# Patient Record
Sex: Female | Born: 1950 | Race: White | Hispanic: No | Marital: Married | Smoking: Former smoker
Health system: Southern US, Community
[De-identification: ages and names within clinical notes are randomized; demographics above are authoritative.]

## PROBLEM LIST (undated history)

## (undated) DIAGNOSIS — M545 Low back pain, unspecified: Secondary | ICD-10-CM

## (undated) DIAGNOSIS — I10 Essential (primary) hypertension: Secondary | ICD-10-CM

## (undated) HISTORY — DX: Essential (primary) hypertension: I10

## (undated) HISTORY — DX: Low back pain: M54.5

## (undated) HISTORY — PX: BACK SURGERY: SHX140

## (undated) HISTORY — DX: Low back pain, unspecified: M54.50

---

## 2003-11-27 ENCOUNTER — Ambulatory Visit (HOSPITAL_COMMUNITY): Admission: RE | Admit: 2003-11-27 | Discharge: 2003-11-27 | Payer: Self-pay

## 2004-05-16 ENCOUNTER — Emergency Department (HOSPITAL_COMMUNITY): Admission: EM | Admit: 2004-05-16 | Discharge: 2004-05-16 | Payer: Self-pay | Admitting: Family Medicine

## 2004-12-19 ENCOUNTER — Other Ambulatory Visit: Admission: RE | Admit: 2004-12-19 | Discharge: 2004-12-19 | Payer: Self-pay | Admitting: Obstetrics and Gynecology

## 2005-01-07 ENCOUNTER — Encounter: Admission: RE | Admit: 2005-01-07 | Discharge: 2005-01-07 | Payer: Self-pay | Admitting: Obstetrics and Gynecology

## 2005-03-21 ENCOUNTER — Encounter (INDEPENDENT_AMBULATORY_CARE_PROVIDER_SITE_OTHER): Payer: Self-pay | Admitting: *Deleted

## 2005-03-21 ENCOUNTER — Ambulatory Visit (HOSPITAL_COMMUNITY): Admission: RE | Admit: 2005-03-21 | Discharge: 2005-03-21 | Payer: Self-pay | Admitting: Obstetrics and Gynecology

## 2005-11-04 ENCOUNTER — Emergency Department (HOSPITAL_COMMUNITY): Admission: EM | Admit: 2005-11-04 | Discharge: 2005-11-04 | Payer: Self-pay | Admitting: Family Medicine

## 2005-12-10 ENCOUNTER — Ambulatory Visit: Payer: Self-pay | Admitting: Family Medicine

## 2005-12-30 ENCOUNTER — Other Ambulatory Visit: Admission: RE | Admit: 2005-12-30 | Discharge: 2005-12-30 | Payer: Self-pay | Admitting: Obstetrics and Gynecology

## 2006-01-09 ENCOUNTER — Encounter: Admission: RE | Admit: 2006-01-09 | Discharge: 2006-01-09 | Payer: Self-pay | Admitting: Obstetrics and Gynecology

## 2006-06-17 ENCOUNTER — Ambulatory Visit: Payer: Self-pay | Admitting: Family Medicine

## 2006-08-18 ENCOUNTER — Ambulatory Visit: Payer: Self-pay | Admitting: Family Medicine

## 2006-08-18 LAB — CONVERTED CEMR LAB
CO2: 31 meq/L (ref 19–32)
Chloride: 108 meq/L (ref 96–112)
Creatinine, Ser: 0.8 mg/dL (ref 0.4–1.2)
Glucose, Bld: 78 mg/dL (ref 70–99)
Potassium: 3.8 meq/L (ref 3.5–5.1)
Sodium: 143 meq/L (ref 135–145)

## 2006-09-22 ENCOUNTER — Ambulatory Visit: Payer: Self-pay | Admitting: Family Medicine

## 2006-09-22 LAB — CONVERTED CEMR LAB
ALT: 19 units/L (ref 0–40)
Albumin: 3.8 g/dL (ref 3.5–5.2)
Alkaline Phosphatase: 65 units/L (ref 39–117)
Basophils Absolute: 0 10*3/uL (ref 0.0–0.1)
Basophils Relative: 0.3 % (ref 0.0–1.0)
CO2: 27 meq/L (ref 19–32)
Chol/HDL Ratio, serum: 2.6
Cholesterol: 174 mg/dL (ref 0–200)
GFR calc non Af Amer: 79 mL/min
Glomerular Filtration Rate, Af Am: 95 mL/min/{1.73_m2}
Glucose, Bld: 104 mg/dL — ABNORMAL HIGH (ref 70–99)
LDL Cholesterol: 96 mg/dL (ref 0–99)
Lymphocytes Relative: 51 % — ABNORMAL HIGH (ref 12.0–46.0)
Monocytes Absolute: 0.3 10*3/uL (ref 0.2–0.7)
Monocytes Relative: 6.4 % (ref 3.0–11.0)
Platelets: 261 10*3/uL (ref 150–400)
Potassium: 3.6 meq/L (ref 3.5–5.1)
RBC: 4.4 M/uL (ref 3.87–5.11)
Sodium: 138 meq/L (ref 135–145)
TSH: 1.28 microintl units/mL (ref 0.35–5.50)
Total Bilirubin: 0.8 mg/dL (ref 0.3–1.2)
Total Protein: 6.8 g/dL (ref 6.0–8.3)
VLDL: 11 mg/dL (ref 0–40)
WBC: 4.4 10*3/uL — ABNORMAL LOW (ref 4.5–10.5)

## 2007-03-23 ENCOUNTER — Encounter: Payer: Self-pay | Admitting: Family Medicine

## 2007-03-26 ENCOUNTER — Telehealth: Payer: Self-pay | Admitting: Family Medicine

## 2007-03-30 ENCOUNTER — Encounter: Admission: RE | Admit: 2007-03-30 | Discharge: 2007-03-30 | Payer: Self-pay | Admitting: Specialist

## 2007-04-14 ENCOUNTER — Encounter: Admission: RE | Admit: 2007-04-14 | Discharge: 2007-04-14 | Payer: Self-pay | Admitting: Obstetrics and Gynecology

## 2007-07-19 ENCOUNTER — Telehealth: Payer: Self-pay | Admitting: Family Medicine

## 2007-07-19 ENCOUNTER — Ambulatory Visit: Payer: Self-pay | Admitting: Family Medicine

## 2007-07-19 DIAGNOSIS — I10 Essential (primary) hypertension: Secondary | ICD-10-CM | POA: Insufficient documentation

## 2007-07-19 DIAGNOSIS — M545 Low back pain, unspecified: Secondary | ICD-10-CM | POA: Insufficient documentation

## 2007-09-01 ENCOUNTER — Ambulatory Visit: Payer: Self-pay | Admitting: Family Medicine

## 2007-11-19 ENCOUNTER — Encounter: Admission: RE | Admit: 2007-11-19 | Discharge: 2007-11-19 | Payer: Self-pay | Admitting: Family Medicine

## 2007-11-19 ENCOUNTER — Encounter (INDEPENDENT_AMBULATORY_CARE_PROVIDER_SITE_OTHER): Payer: Self-pay | Admitting: *Deleted

## 2007-11-19 ENCOUNTER — Telehealth (INDEPENDENT_AMBULATORY_CARE_PROVIDER_SITE_OTHER): Payer: Self-pay | Admitting: Family Medicine

## 2007-11-19 ENCOUNTER — Ambulatory Visit: Payer: Self-pay | Admitting: Family Medicine

## 2007-11-19 ENCOUNTER — Telehealth (INDEPENDENT_AMBULATORY_CARE_PROVIDER_SITE_OTHER): Payer: Self-pay | Admitting: *Deleted

## 2007-11-19 DIAGNOSIS — R079 Chest pain, unspecified: Secondary | ICD-10-CM | POA: Insufficient documentation

## 2007-11-22 ENCOUNTER — Encounter (INDEPENDENT_AMBULATORY_CARE_PROVIDER_SITE_OTHER): Payer: Self-pay | Admitting: Family Medicine

## 2007-11-26 ENCOUNTER — Ambulatory Visit: Payer: Self-pay | Admitting: Cardiology

## 2007-12-09 ENCOUNTER — Encounter (INDEPENDENT_AMBULATORY_CARE_PROVIDER_SITE_OTHER): Payer: Self-pay | Admitting: *Deleted

## 2007-12-09 ENCOUNTER — Ambulatory Visit: Payer: Self-pay | Admitting: Family Medicine

## 2008-04-18 ENCOUNTER — Telehealth (INDEPENDENT_AMBULATORY_CARE_PROVIDER_SITE_OTHER): Payer: Self-pay | Admitting: *Deleted

## 2008-04-27 ENCOUNTER — Ambulatory Visit: Payer: Self-pay | Admitting: Family Medicine

## 2008-04-28 ENCOUNTER — Encounter (INDEPENDENT_AMBULATORY_CARE_PROVIDER_SITE_OTHER): Payer: Self-pay | Admitting: *Deleted

## 2008-04-28 LAB — CONVERTED CEMR LAB
BUN: 13 mg/dL (ref 6–23)
Chloride: 108 meq/L (ref 96–112)
Creatinine, Ser: 0.8 mg/dL (ref 0.4–1.2)
GFR calc Af Amer: 95 mL/min
GFR calc non Af Amer: 78 mL/min
Potassium: 3.7 meq/L (ref 3.5–5.1)

## 2008-07-25 ENCOUNTER — Telehealth (INDEPENDENT_AMBULATORY_CARE_PROVIDER_SITE_OTHER): Payer: Self-pay | Admitting: *Deleted

## 2008-12-05 ENCOUNTER — Ambulatory Visit: Payer: Self-pay | Admitting: Family Medicine

## 2008-12-05 DIAGNOSIS — N951 Menopausal and female climacteric states: Secondary | ICD-10-CM | POA: Insufficient documentation

## 2008-12-05 DIAGNOSIS — Z87448 Personal history of other diseases of urinary system: Secondary | ICD-10-CM | POA: Insufficient documentation

## 2008-12-05 LAB — CONVERTED CEMR LAB
Glucose, Urine, Semiquant: NEGATIVE
Nitrite: NEGATIVE
Specific Gravity, Urine: 1.02
WBC Urine, dipstick: NEGATIVE
pH: 6

## 2008-12-06 ENCOUNTER — Encounter: Payer: Self-pay | Admitting: Family Medicine

## 2008-12-07 ENCOUNTER — Encounter (INDEPENDENT_AMBULATORY_CARE_PROVIDER_SITE_OTHER): Payer: Self-pay | Admitting: *Deleted

## 2008-12-15 ENCOUNTER — Telehealth: Payer: Self-pay | Admitting: Family Medicine

## 2009-08-15 ENCOUNTER — Encounter (INDEPENDENT_AMBULATORY_CARE_PROVIDER_SITE_OTHER): Payer: Self-pay | Admitting: *Deleted

## 2009-09-17 ENCOUNTER — Ambulatory Visit: Payer: Self-pay | Admitting: Family Medicine

## 2009-09-17 DIAGNOSIS — R5381 Other malaise: Secondary | ICD-10-CM | POA: Insufficient documentation

## 2009-09-17 DIAGNOSIS — R5383 Other fatigue: Secondary | ICD-10-CM

## 2009-09-17 DIAGNOSIS — J45909 Unspecified asthma, uncomplicated: Secondary | ICD-10-CM | POA: Insufficient documentation

## 2009-09-18 LAB — CONVERTED CEMR LAB
ALT: 24 units/L (ref 0–35)
AST: 20 units/L (ref 0–37)
Basophils Relative: 0.4 % (ref 0.0–3.0)
Chloride: 107 meq/L (ref 96–112)
Eosinophils Relative: 0.4 % (ref 0.0–5.0)
GFR calc non Af Amer: 68.01 mL/min (ref >60)
HCT: 35.5 % — ABNORMAL LOW (ref 36.0–46.0)
Hemoglobin: 11.8 g/dL — ABNORMAL LOW (ref 12.0–15.0)
LDL Cholesterol: 102 mg/dL — ABNORMAL HIGH (ref 0–99)
Lymphs Abs: 2 10*3/uL (ref 0.7–4.0)
Monocytes Relative: 6.9 % (ref 3.0–12.0)
Neutro Abs: 2.9 10*3/uL (ref 1.4–7.7)
Platelets: 240 10*3/uL (ref 150.0–400.0)
Potassium: 4.1 meq/L (ref 3.5–5.1)
RBC: 4.2 M/uL (ref 3.87–5.11)
Sodium: 142 meq/L (ref 135–145)
TSH: 2.09 microintl units/mL (ref 0.35–5.50)
Total Bilirubin: 0.8 mg/dL (ref 0.3–1.2)
Total CHOL/HDL Ratio: 3
Total Protein: 7.3 g/dL (ref 6.0–8.3)
VLDL: 12.6 mg/dL (ref 0.0–40.0)
WBC: 5.3 10*3/uL (ref 4.5–10.5)

## 2009-09-19 ENCOUNTER — Encounter (INDEPENDENT_AMBULATORY_CARE_PROVIDER_SITE_OTHER): Payer: Self-pay | Admitting: *Deleted

## 2009-10-08 ENCOUNTER — Telehealth (INDEPENDENT_AMBULATORY_CARE_PROVIDER_SITE_OTHER): Payer: Self-pay | Admitting: *Deleted

## 2009-10-08 ENCOUNTER — Telehealth: Payer: Self-pay | Admitting: Family Medicine

## 2009-10-10 ENCOUNTER — Telehealth: Payer: Self-pay | Admitting: Family Medicine

## 2010-03-01 ENCOUNTER — Telehealth: Payer: Self-pay | Admitting: Family Medicine

## 2010-03-01 ENCOUNTER — Telehealth: Payer: Self-pay | Admitting: Internal Medicine

## 2010-03-05 ENCOUNTER — Telehealth: Payer: Self-pay | Admitting: Family Medicine

## 2010-03-27 ENCOUNTER — Telehealth: Payer: Self-pay | Admitting: Family Medicine

## 2010-04-22 ENCOUNTER — Encounter: Payer: Self-pay | Admitting: Family Medicine

## 2010-05-02 ENCOUNTER — Telehealth: Payer: Self-pay | Admitting: Family Medicine

## 2010-05-21 ENCOUNTER — Encounter
Admission: RE | Admit: 2010-05-21 | Discharge: 2010-05-21 | Payer: Self-pay | Admitting: Physical Medicine & Rehabilitation

## 2010-05-28 ENCOUNTER — Encounter: Payer: Self-pay | Admitting: Family Medicine

## 2010-05-31 ENCOUNTER — Telehealth: Payer: Self-pay | Admitting: Family Medicine

## 2010-07-09 ENCOUNTER — Encounter
Admission: RE | Admit: 2010-07-09 | Discharge: 2010-07-09 | Payer: Self-pay | Source: Home / Self Care | Attending: Physical Medicine & Rehabilitation | Admitting: Physical Medicine & Rehabilitation

## 2010-07-15 ENCOUNTER — Encounter: Payer: Self-pay | Admitting: Family Medicine

## 2010-07-17 ENCOUNTER — Telehealth: Payer: Self-pay | Admitting: Family Medicine

## 2010-07-26 ENCOUNTER — Ambulatory Visit: Payer: Self-pay | Admitting: Family Medicine

## 2010-07-30 ENCOUNTER — Telehealth: Payer: Self-pay | Admitting: Family Medicine

## 2010-08-09 ENCOUNTER — Encounter (INDEPENDENT_AMBULATORY_CARE_PROVIDER_SITE_OTHER): Payer: Self-pay | Admitting: *Deleted

## 2010-09-16 ENCOUNTER — Telehealth: Payer: Self-pay | Admitting: Family Medicine

## 2010-10-27 ENCOUNTER — Encounter: Payer: Self-pay | Admitting: Family Medicine

## 2010-11-01 ENCOUNTER — Telehealth (INDEPENDENT_AMBULATORY_CARE_PROVIDER_SITE_OTHER): Payer: Self-pay | Admitting: *Deleted

## 2010-11-05 NOTE — Progress Notes (Signed)
Summary: refill request to [facility] on [address] in [place]  Phone Note Refill Request Message from:  Fax from Pharmacy on October 08, 2009 4:18 PM  Refills Requested: Medication #1:  TRAMADOL HCL 50 MG TABS 1-2 by mouth every 6 hours   Dosage confirmed as above?Dosage Confirmed   Brand Name Necessary? Yes   Last Refilled: 09/17/2009 refill request to [facility] on [address]  last ov-09/17/09 Army Fossa CMA  October 08, 2009 4:20 PM  Initial call taken by: Michaelle Copas,  October 08, 2009 4:18 PM  Follow-up for Phone Call        did she get #120 on 12/13--- if yes she is using too many--and she needs pain management.   Follow-up by: Loreen Freud DO,  October 08, 2009 7:52 PM  Additional Follow-up for Phone Call Additional follow up Details #1::        pt states she received 120 on 12/13. She states they are not working as well as they use to. She is okay with going to pain management.  Additional Follow-up by: Army Fossa CMA,  October 09, 2009 8:53 AM

## 2010-11-05 NOTE — Progress Notes (Signed)
Summary: NEEDS ALT MED FOR PAIN  Phone Note Call from Patient Call back at Home Phone [phone]   Caller: Patient Summary of Call: PATIENT WAS TOLD TRAMODOL WOULDNT BE REFILLED - SHE SAID SHE DOESNT HAVE AMY FOR THE WEEEKEND BUT WANT TOLD WHAT SHE CAN USE FOR PAIN Initial call taken by: Okey Regal Spring,  Mar 01, 2010 3:57 PM  Follow-up for Phone Call        dr Yancy Hascall pls advise. SEE PHONE NOTE 03-01-10.............Marland KitchenFelecia Deloach CMA  Mar 01, 2010 4:10 PM   Additional Follow-up for Phone Call Additional follow up Details #1::        Very low dose ibuprofen with food  or Tylenol  Additional Follow-up by: Marga Melnick MD,  Mar 01, 2010 4:13 PM    Additional Follow-up for Phone Call Additional follow up Details #2::    pt aware...............Marland KitchenFelecia Deloach CMA  Mar 01, 2010 4:57 PM

## 2010-11-05 NOTE — Progress Notes (Signed)
Summary: PAIN CLINIC REFERRALS  Phone Note Other Incoming   Summary of Call: IN REFERENCE TO PAIN CLINIC REFERRAL (THERE HAVE BEEN (2))............Marland KitchenPER FAX FROM [facility], AFTER THE PATIENT HAS REPEATEDELY RESCHEDULED HER APPTS WITH THEM, THE PATIENT HAS NOW NOSHOWED FOR HER APPT WHICH WAS ON 07-15-2010 W/DR Wynn Banker, I WILL NOTIFY DR. Laury Axon & CONTACT PT ABOUT RSC'D.  Magdalen Spatz [facility],  July 17, 2010 10:57 AM  I HAVE ATTEMPTED TO REACH PATIENT, NO ANS, MAILBOX IS FULL, I LEFT OUR CALL BACK PER NUMERIC PAGE.   Initial call taken by: Magdalen Spatz [facility],  July 17, 2010 11:08 AM  Follow-up for Phone Call        mail letter certified and regular Follow-up by: Loreen Freud DO,  July 17, 2010 1:19 PM  Additional Follow-up for Phone Call Additional follow up Details #1::        Letter(s) to be sent to patient as Dr. Laury Axon instructed. Additional Follow-up by: Magdalen Spatz [facility],  July 18, 2010 11:06 AM

## 2010-11-05 NOTE — Progress Notes (Signed)
Summary: talk to dr only  Phone Note Call from Patient Call back at [facility] Phone [phone]   Caller: Patient Summary of Call: Pt called and states that she would like to talk to Dr.Lowne only. She has questions that she would like to talk to you about the pain clinic referral. She states she wants to talk to [facility] directly, but she does not want to come in for an appt. I tried to find out what type of questions and was not able to find out what exactly she needed. Army Fossa CMA  March 27, 2010 2:57 PM   Follow-up for Phone Call        Pt has appointment with pain management at [facility] July 25th.  She will need a refill by next friday---- ok to refill med next week.---tramadol. Follow-up by: Loreen Freud DO,  March 27, 2010 3:42 PM

## 2010-11-05 NOTE — Progress Notes (Signed)
Summary: REFILL ON MED  Phone Note Call from Patient Call back at Home Phone [phone]   Caller: Patient Summary of Call: PT STATES THAT SHE HAD TO RESCHEDULE APPT WITH PAIN CLINIC DUE TO FINANCIAL DIFFICULTY AND WOULD LIKE A REFILL OF PAIN MED. LAST OV 09-17-09,TRAMADOL LAST REFILL 05-02-10 #120............Marland KitchenFelecia Deloach CMA  May 31, 2010 3:28 PM   Follow-up for Phone Call        refill sent Follow-up by: Loreen Freud DO,  May 31, 2010 4:06 PM  Additional Follow-up for Phone Call Additional follow up Details #1::        pt made aware     Almeta Monas CMA [facility])  May 31, 2010 5:01 PM     Prescriptions: TRAMADOL HCL 50 MG TABS (TRAMADOL HCL) 1-2 by mouth every 6 hours  #120 Each x 2   Entered and Authorized by:   Loreen Freud DO   Signed by:   Loreen Freud DO on 05/31/2010   Method used:   Electronically to        [facility] [address] (retail)       [phone]       [place], [place]  [address]       Ph: [phone]       Fax: [phone]   RxID:   [id number]

## 2010-11-05 NOTE — Letter (Signed)
Summary: Patient Rescheduled/[facility]  Patient Rescheduled/[facility]   Imported By: Lanelle Bal 04/30/2010 09:27:51  _____________________________________________________________________  External Attachment:    Type:   Image     Comment:   External Document

## 2010-11-05 NOTE — Letter (Signed)
Summary: [facility] Cancellation/[facility]  Patient Cancellation/[facility]   Imported By: Lanelle Bal 06/04/2010 08:15:20  _____________________________________________________________________  External Attachment:    Type:   Image     Comment:   External Document

## 2010-11-05 NOTE — Progress Notes (Signed)
Summary: patient needs med for weekend  Phone Note Call from Patient   Summary of Call: patient needs tramodol for weekend - [facility]  Initial call taken by: Okey Regal Spring,  Mar 01, 2010 2:53 PM  Follow-up for Phone Call        pt taking too much---needs referral to [facility]----she can call billing and set up payment plan.  Follow-up by: Loreen Freud DO,  Mar 01, 2010 3:01 PM  Additional Follow-up for Phone Call Additional follow up Details #1::        I informed pt of this and we could refill next week, gave her the number to billing so she could call. Pt agreed to call . Army Fossa CMA  Mar 01, 2010 3:03 PM

## 2010-11-05 NOTE — Letter (Signed)
Summary: Patient No Show/[facility]  [facility]/ [facility]   Imported By: Lanelle Bal 07/24/2010 15:56:33  _____________________________________________________________________  External Attachment:    Type:   Image     Comment:   External Document

## 2010-11-05 NOTE — Assessment & Plan Note (Signed)
Summary: RIGHT SIDE AND BACK PAIN//PH   Vital Signs:  Patient profile:   60 year old female Height:      65 inches Weight:      183 pounds BMI:     30.56 Pulse rate:   92 / minute BP sitting:   120 / 88  (left arm)  Vitals Entered By: Doristine Devoid CMA (July 26, 2010 10:57 AM) CC: Back pain and spasms x1 week mainly lower back worse when sitting   History of Present Illness: 60 yo woman here today w/ back pain.  hx of back problems w/ surgery in 1994.  taking Tramadol regularly for pain.  went to [employer] events this weekend and 'i think i overdid it'.  now w/ increased soreness and stiffness on R side.  has been doing Epson salt baths w/ temporary relief.  'it feels like a spasm'.  pain will radiate into R leg.  denies numbness.  reports some leg weakness but feels this is more due to spasm.  no incontinence.  pain is worse w/ sitting- most difficult to change positions.  missed 2 appts w/ pain clinic and was told they wouldn't see her if she missed 2 appts.  worried that Dr Laury Axon won't give her meds anymore- told her this is something she will need to w/o w/ Dr Laury Axon.  Current Medications (verified): 1)  Lisinopril-Hydrochlorothiazide 20-12.5 Mg Tabs (Lisinopril-Hydrochlorothiazide) .Marland Kitchen.. 1 By Mouth Once Daily 2)  Tramadol Hcl 50 Mg Tabs (Tramadol Hcl) .Marland Kitchen.. 1-2 By Mouth Every 6 Hours 3)  Ventolin Hfa 108 (90 Base) Mcg/act  Aers (Albuterol Sulfate) .... Use 2 Puffs As Directed 4)  Singulair 10 Mg Tabs (Montelukast Sodium) .Marland Kitchen.. 1 By Mouth Once Daily  Allergies (verified): 1)  ! Lortab  Review of Systems      See HPI  Physical Exam  General:  obvious uncomfortable, moving gingerly Msk:  + paraspinal spasm over R lumbar region.  no TTP over spine.  + TTP over R glute.  + SLR on R, (-) on L.  pain w/ back flexion and extension. Pulses:  +2 DP/PT Extremities:  no C/C/E Neurologic:  strength normal in all extremities and DTRs symmetrical and normal.     Impression &  Recommendations:  Problem # 1:  LOW BACK PAIN, CHRONIC (ICD-724.2) Assessment Deteriorated pt w/ acute worsening of chronic condition due to overuse this weekend.  start Pred pack for inflammation and muscle relaxer as needed.  continue tramadol as needed.  reviewed supportive care and red flags that should prompt return.  Pt expresses understanding and is in agreement w/ this plan. Her updated medication list for this problem includes:    Tramadol Hcl 50 Mg Tabs (Tramadol hcl) .Marland Kitchen... 1-2 by mouth every 6 hours    Cyclobenzaprine Hcl 10 Mg Tabs (Cyclobenzaprine hcl) .Marland Kitchen... 1 by mouth 2 times daily as needed for back pain  Complete Medication List: 1)  Lisinopril-hydrochlorothiazide 20-12.5 Mg Tabs (Lisinopril-hydrochlorothiazide) .Marland Kitchen.. 1 by mouth once daily 2)  Tramadol Hcl 50 Mg Tabs (Tramadol hcl) .Marland Kitchen.. 1-2 by mouth every 6 hours 3)  Ventolin Hfa 108 (90 Base) Mcg/act Aers (Albuterol sulfate) .... Use 2 puffs as directed 4)  Singulair 10 Mg Tabs (Montelukast sodium) .Marland Kitchen.. 1 by mouth once daily 5)  Cyclobenzaprine Hcl 10 Mg Tabs (Cyclobenzaprine hcl) .Marland Kitchen.. 1 by mouth 2 times daily as needed for back pain 6)  Prednisone (pak) 10 Mg Tabs (Prednisone) .Marland Kitchen.. 12 day pack- take as directed.  take w/ food.  Patient Instructions: 1)  This appears to be muscle related and should improve 2)  Take the Prednisone as directed for inflammation- take w/ food to avoid upset stomach 3)  Use the cyclobenzaprine (muscle relaxer) at night and on the weekends- it will make you drowsy so no driving! 4)  Use a heating pad for pain relief 5)  Continue the Tramadol as needed 6)  If no improvement in the next 7 days or at any time worsening- call! 7)  Hang in there!! Prescriptions: PREDNISONE (PAK) 10 MG TABS (PREDNISONE) 12 day pack- take as directed.  take w/ food.  #1 pack x 0   Entered and Authorized by:   Neena Rhymes MD   Signed by:   Neena Rhymes MD on 07/26/2010   Method used:   Electronically to         [facility] [address] (retail)       [phone]       [place], [place]  [address]       Ph: [phone]       Fax: [phone]   RxID:   [id number] CYCLOBENZAPRINE HCL 10 MG  TABS (CYCLOBENZAPRINE HCL) 1 by mouth 2 times daily as needed for back pain  #20 x 0   Entered and Authorized by:   Neena Rhymes MD   Signed by:   Neena Rhymes MD on 07/26/2010   Method used:   Electronically to        [facility] [address] (retail)       [phone]       [place], [place]  [address]       Ph: [phone]       Fax: [phone]   RxID:   [id number]    Orders Added: 1)  Est. Patient Level III [09323]

## 2010-11-05 NOTE — Letter (Signed)
Summary: [place] No Show Letter  [facility] [place]  [address] [place], [place] [address]   Phone: [phone]  Fax: [phone]    08/09/2010 MRN: [id number]  Morgan [name] [address] [place], [place]  [address]   Dear Ms. [name],   Our records indicate that you have rescheduled multiple appointments  & missed your scheduled [facility] with [facility] on [facility].  Please contact this office to reschedule your appointment as soon as possible.  It is important that you keep your scheduled appointments with your physician, so we can provide you the best care possible.  Please be advised that there may be a charge for "no show" appointments, and also that after so many reschedules and/or noshows Pain Clinics will not allow you to reschedule with their office.  Appointments with Pain Clinics are very hard to get.    Sincerely,   [place] [facility]

## 2010-11-05 NOTE — Progress Notes (Signed)
Summary: PAIN CLINIC REFERRAL  Phone Note Other Incoming   Summary of Call: REFERENCE PAIN CLINIC REFERRAL.......Marland KitchenPATIENT'S INSUR IS A VERY LIMITED PLAN.  THEY DO NOT COVER ANY PAIN MANAGEMENT FACILITIES, COST WOULD BE OUT OF POCKET FOR PT.  I S/W PT INFORMED HER OF THIS, AND GAVE HER PHONE NUMBER TO CALL [facility] & APPLY FOR THE "BAR SPECIAL BILLING".  PATIENT STATES SHE WILL CALL, AND WHEN CALL ME BACK ONCE SHE KNOWS SOMETHING.  AT THIS TIME, THE REFERRAL IS ON HOLD. Initial call taken by: Magdalen Spatz [facility],  October 10, 2009 2:40 PM

## 2010-11-05 NOTE — Progress Notes (Signed)
Summary: CALL-A-NURSE  Phone Note Call from Patient   Summary of Call: [facility] Triage Call Report Triage Record Num: [id number] Operator: Freddie Breech Patient Name: Morgan [name] Call Date & Time: 03/03/2010 10:40:39AM Patient Phone: [phone] PCP: Lelon Perla Patient Gender: Female PCP Fax : Patient DOB: 1950/11/24 Practice Name: [facility] Reason for Call: Pt is calling for a refill on Tramadol. She was advised per the ofc she could not get a refill, she needs to be seen by pain management. Advised she will need to be seen at [facility] today if her pain is severe. She is displeased that the MD would not refill her med. Protocol(s) Used: Medication Question Calls, No Triage (Adults) Recommended Outcome per Protocol: Call Provider within 24 Hours Override Outcome if Used in Protocol: Call Provider When Office is Open RN Reason for Override Outcome: Nursing Judgement Used. Reason for Outcome: Caller requesting a non urgent new prescription or refill and triager unable to refill per unit policy Care Advice:  Follow-up for Phone Call        PT STATES THAT SHE DID NOT GO TO URGENT CARE BECAUSE SHE DOES NOT HAVE THE MONEY TO GO. PT STATES THAT SHE JUST SUFFER THROUGH THE WEEKEND WITHOUT ANY MEDS. PT NEEDS MED IN ORDER TO GO TO WORK. PT WOULD LIKE TO KNOW IF MED WILL BE REFILL NOW. PLS ADVISE..............Marland KitchenFelecia Deloach CMA  Mar 05, 2010 8:54 AM   Additional Follow-up for Phone Call Additional follow up Details #1::        refilled x1  ---  Additional Follow-up by: Loreen Freud DO,  Mar 05, 2010 9:53 AM    Additional Follow-up for Phone Call Additional follow up Details #2::    left pt detail message rx sent to pharmacy and to return call if she has any other concerns...Marland KitchenMarland KitchenFelecia Deloach CMA  Mar 05, 2010 10:08 AM   Prescriptions: TRAMADOL HCL 50 MG TABS (TRAMADOL HCL) 1-2 by mouth every 6 hours  #120 Each x 0   Entered and Authorized by:   Loreen Freud DO  Signed by:   Loreen Freud DO on 03/05/2010   Method used:   Electronically to        [facility] [address] (retail)       [phone]       [place], [place]  [address]       Ph: [phone]       Fax: [phone]   RxID:   [id number]

## 2010-11-05 NOTE — Progress Notes (Signed)
Summary: refill  Phone Note Refill Request Call back at Home Phone [phone]   Refills Requested: Medication #1:  TRAMADOL HCL 50 MG TABS 1-2 by mouth every 6 hours [facility] [address]...........Marland KitchenFelecia Deloach CMA  July 30, 2010 2:28 PM    Follow-up for Phone Call        Please advise. Lucious Groves CMA  July 30, 2010 3:48 PM   Additional Follow-up for Phone Call Additional follow up Details #1::        refill x1 2 refills---- pt saw Dr Beverely Low secondary to pain worsening---if she is no better she needs to make sure she has f/u Additional Follow-up by: Loreen Freud DO,  July 30, 2010 5:27 PM    Additional Follow-up for Phone Call Additional follow up Details #2::    pt aware Rx has been sent, and agreed to f/u if no better..... Almeta Monas CMA [facility])  July 31, 2010 8:17 AM   Prescriptions: TRAMADOL HCL 50 MG TABS (TRAMADOL HCL) 1-2 by mouth every 6 hours  #120 Each x 2   Entered by:   Almeta Monas CMA (AAMA)   Authorized by:   Loreen Freud DO   Signed by:   Almeta Monas CMA (AAMA) on 07/31/2010   Method used:   Faxed to ...       [facility] [address] (retail)       [phone]       [place], [place]  [address]       Ph: [phone]       Fax: [phone]   RxID:   [id number]

## 2010-11-05 NOTE — Progress Notes (Signed)
Summary: Refill-Tramadol  Phone Note Refill Request Call back at Home Phone [phone] Message from:  Patient on May 02, 2010 12:02 PM  Refills Requested: Medication #1:  TRAMADOL HCL 50 MG TABS 1-2 by mouth every 6 hours patient has appt with pain clinic [phone] - she wants refill for tramodol - [facility] [address]  Initial call taken by: Okey Regal Spring,  May 02, 2010 12:03 PM  Follow-up for Phone Call        last OV 09-17-09, 04-04-10 #120........Marland KitchenFelecia Deloach CMA  May 02, 2010 2:22 PM   Additional Follow-up for Phone Call Additional follow up Details #1::        refill x1 Additional Follow-up by: Loreen Freud DO,  May 02, 2010 2:36 PM    Prescriptions: TRAMADOL HCL 50 MG TABS (TRAMADOL HCL) 1-2 by mouth every 6 hours  #120 Each x 0   Entered by:   Lucious Groves CMA   Authorized by:   Loreen Freud DO   Signed by:   Lucious Groves CMA on 05/02/2010   Method used:   Telephoned to ...       [facility] [address] [id number]* (retail)       [address] [place], [place]  [address]       Ph: [phone]       Fax: [phone]   RxID:   [id number]

## 2010-11-05 NOTE — Progress Notes (Signed)
Summary: Forms   Phone Note Outgoing Call   Summary of Call: Pt brought in forms to be filled out, I called pt and left a message per Dr.Lowne it would be easier for her to come in and fill them out with Dr.Lowne.  Initial call taken by: Army Fossa CMA,  October 08, 2009 8:41 AM

## 2010-11-07 NOTE — Progress Notes (Signed)
Summary: REFILL  Phone Note Refill Request Call back at Home Phone [phone] Message from:  Patient on September 16, 2010 12:50 PM  Refills Requested: Medication #1:  TRAMADOL HCL 50 MG TABS 1-2 by mouth every 6 hours   Dosage confirmed as above?Dosage Confirmed   Supply Requested: 1 month [address]  Next Appointment Scheduled: NONE Initial call taken by: Lavell Islam,  September 16, 2010 12:51 PM  Follow-up for Phone Call        seen 07/26/10 missed multiple appts at the pain clinic and last filled 10/21. PLease advise Follow-up by: Almeta Monas CMA [facility]),  September 16, 2010 3:14 PM  Additional Follow-up for Phone Call Additional follow up Details #1::        Pt had 2 refills on rx given in October----she should have refill--- please call pharmacy Additional Follow-up by: Loreen Freud DO,  September 16, 2010 10:01 PM    Additional Follow-up for Phone Call Additional follow up Details #2::    spk with patient and advised that she had refills on her last RX. Stated she didn't know and will call pharmacy for  a refill..... Almeta Monas CMA [facility] Dull)  September 17, 2010 10:36 AM

## 2010-11-13 NOTE — Progress Notes (Signed)
Summary: --refill  Phone Note Refill Request Call back at Home Phone [phone]   Refills Requested: Medication #1:  TRAMADOL HCL 50 MG TABS 1-2 by mouth every 6 hours Pt states that she is going to need refill on med if appt for pain management is not scheduled already. Check with renee Info has been faxed just awaiting appt info per renee which can take more than 5 weeks.Marland KitchenMarland KitchenMarland KitchenFelecia Deloach CMA  November 01, 2010 9:28 AM    Follow-up for Phone Call        last filled 10-14-10 #120 2, last OV 07-26-10.Marti Sleigh Deloach CMA  November 01, 2010 9:38 AM   Additional Follow-up for Phone Call Additional follow up Details #1::        refill x1 Additional Follow-up by: Loreen Freud DO,  November 01, 2010 1:09 PM    Additional Follow-up for Phone Call Additional follow up Details #2::    Left message to call back /pharmacy needed.... Almeta Monas CMA [facility])  November 01, 2010 1:27 PM  Left message to call office ..............Marland KitchenFelecia Deloach CMA  November 01, 2010 4:09 PM  Left message to call office ..........Marland KitchenFelecia Deloach CMA  November 04, 2010 2:46 PM  left message to call office ..................Marland KitchenFelecia Deloach CMA  November 05, 2010 8:27 AM   Additional Follow-up for Phone Call Additional follow up Details #3:: Details for Additional Follow-up Action Taken: [facility] [address] IS WERE SHE WANT THE MED TO GO  Rx sent...................Marland KitchenFelecia Deloach CMA  November 05, 2010 11:03 AM  Additional Follow-up by: Freddy Jaksch,  November 05, 2010 10:56 AM  Prescriptions: TRAMADOL HCL 50 MG TABS (TRAMADOL HCL) 1-2 by mouth every 6 hours  #120 Each x 0   Entered by:   Jeremy Johann CMA   Authorized by:   Loreen Freud DO   Signed by:   Jeremy Johann CMA on 11/05/2010   Method used:   Faxed to ...       [facility] [address] [phone]* (retail)       [address]       [place], [place]  [address]       Ph: [phone]       Fax: [phone]   RxID:   [id number]

## 2010-11-25 ENCOUNTER — Encounter: Payer: Self-pay | Admitting: Family Medicine

## 2010-12-03 NOTE — Letter (Signed)
Summary: [facility]  [facility]   Imported By: Maryln Gottron 11/29/2010 11:32:33  _____________________________________________________________________  External Attachment:    Type:   Image     Comment:   External Document

## 2011-02-18 NOTE — Assessment & Plan Note (Signed)
[facility]                                 ON-CALL NOTE   NAME:[name], Julanne                        MRN:          [id number]  DATE:07/18/2007                            DOB:          01-21-1951    PRIMARY CARE PHYSICIAN:  Dr. Laury Axon.   TELEPHONE NUMBER:  [phone]   SUBJECTIVE:  Ms. [name] has chronic back pain and has run out of her pain  medication.   ASSESSMENT AND PLAN:  I have notified the patient that we do not call in  pain medication over the weekend. I encouraged her to call earlier or to  call a week ahead of running out of her medications, so that she does  not end up in this situation in the future. She was told to make sure  she calls her primary care physician first thing Monday morning. I  received a second call after this initial call from her daughter who  wanted to discuss the issue as well. The same things were explained to  her. If her pain is severe and she cannot tolerate it, she was  instructed to go to the urgent care.     Kerby Nora, MD  Electronically Signed    AB/MedQ  DD: 07/18/2007  DT: 07/19/2007  Job #: [id number]

## 2011-02-18 NOTE — Assessment & Plan Note (Signed)
[facility]                            CARDIOLOGY OFFICE NOTE   NAME:[name], Morgan                        MRN:          [id number]  DATE:11/26/2007                            DOB:          04/01/1951    Morgan [name] is 60 years old.  She had been seen by Dr. Blossom Hoops.  She  had an upper respiratory infection and began to have some asthmatic  symptoms.  She had slight chest discomfort with this.  She stabilized  and she has done well.  She was referred for evaluation.  Since  recovering from her illness, she has had no significant problems.  Physically she has not been particularly active because her work load is  enormous.  She does hope to be back to exercising.  There is no prior  documented coronary disease.   PAST MEDICAL HISTORY:   ALLERGIES:  LORTAB, ALUPENT.   MEDICATIONS:  Tramadol, lisinopril hydrochlorothiazide, and a hormone.   OTHER MEDICAL PROBLEMS:  See the list below.   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   FAMILY HISTORY:  There is family history of strokes.  There are no  documented MIs at young ages.   REVIEW OF SYSTEMS:  She does have some fatigue.  She relates this to her  work schedule.  She does have some arthritic pains and has had some  anxiety over time.  She also mentions that she is having significant hot  flashes related to her menopause.  She has some sweats with this.  Otherwise review of systems is negative.   PHYSICAL EXAM:  Weight is 186 pounds.  Blood pressure today is 168/90.  She is anxious about being here.  She needs blood pressure follow-up.  The patient is oriented person, time and place.  Affect is normal.  HEENT:  Reveals no xanthelasma.  She has normal extraocular motion.  There are no carotid bruits.  There is no jugular venous distention.  Lungs are clear.   Respiratory effort is not labored.  Cardiac exam reveals S1-S2.  There are no clicks or significant murmurs.  The abdomen is soft.  She has normal bowel sounds.  She has no significant peripheral edema.  There are no musculoskeletal  deformities.   EKG reveals minor nonspecific ST-T wave changes.  The most recent labs  available to me are from December 2007.  It is of note that at that time  her HDL was good at 67 and her LDL was also under reasonable control at  96.  Triglycerides were 55.   PROBLEMS:  1. History of surgery for a ruptured disk.  2. Chronic back pain.  3. History of some asthma.  4. Hypertension on medications.  She needs further blood pressure      follow-up.  5. Recent upper respiratory illness with some asthmatic component and      slight chest discomfort.   At this point I believe that she has not  had a cardiac event.  I think  her cardiac status is stable.  The patient and I discussed the whether  or not further testing was necessary.  I believe she is stable.  We  discussed the pros and cons of exercise testing.  She felt that she is  stable and she would prefer to be followed over time and this point.  I  am comfortable with this.  I am recommending that she see Dr. Laury Axon in  the near future for early follow-up and I will see her back in 6 months  in follow-up.     Morgan Abed, MD, [facility]  Electronically Signed    JDK/MedQ  DD: 11/26/2007  DT: 11/27/2007  Job #: [id number]   cc:   Morgan Perla, DO

## 2011-02-21 NOTE — Assessment & Plan Note (Signed)
[facility]                                 ON-CALL NOTE   NAME:[name], Morgan                        MRN:          [id number]  DATE:10/16/2007                            DOB:          1950-11-10    TIME OF CALL:  8:58am   TELEPHONE NUMBER:  [phone]   CALLER:  Dorthula Matas   The patient is having lower abdominal cramping and is vomiting. She was  at urgent care when called. She wanted to know if she could be seen  sooner than they could see her at urgent care. She was told to stay at  urgent care and be seen there since she is already there.   PRIMARY CARE PHYSICIAN:  Dr. Laury Axon.   HOME OFFICE:  [facility].     Arta Silence, MD  Electronically Signed    RNS/MedQ  DD: 10/16/2007  DT: 10/17/2007  Job #: [id number]

## 2011-02-21 NOTE — Op Note (Signed)
NAMEMarland Kitchen  Morgan [name], Morgan [name] NO.:  [id number]   MEDICAL RECORD NO.:  [id number]          PATIENT TYPE:  AMB   LOCATION:  SDC                           FACILITY:  [facility]   PHYSICIAN:  Naima A. Dillard, M.D. DATE OF BIRTH:  06/17/1951   DATE OF PROCEDURE:  03/21/2005  DATE OF DISCHARGE:                                 OPERATIVE REPORT   PREOPERATIVE DIAGNOSES:  1.  Postmenopausal vaginal bleeding.  2.  Endocervical polyp.   POSTOPERATIVE DIAGNOSES:  1.  Postmenopausal vaginal bleeding.  2.  Endocervical polyp.   PROCEDURE:  Dilatation and curettage, hysteroscopy and cervical polypectomy.   SURGEON:  Naima A. Dillard, M.D.   ASSISTANT:  None.   ANESTHESIA:  General laryngeal mask airway.   SPECIMENS:  Endocervical polyp and endometrial curettings sent to pathology.   ESTIMATED BLOOD LOSS:  Minimal.   COMPLICATIONS:  None.   The patient went to PACU in stable condition.   PROCEDURE IN DETAIL:  The patient was taken to the operating room, where she  was placed in the dorsal lithotomy position and prepped and draped in the  normal sterile fashion.  After the patient was examined, a bivalve speculum  was placed in the vagina and the anterior lip of the cervix was grasped with  a single-tooth tenaculum.  The endocervical polyp was removed with polyp  forceps.  The uterus was then sounded to 7 cm.  The cervix was further  dilated with Pratt dilators up to 21.  A hysteroscope was placed through the  uterine cavity.  Both ostia were seen.  All walls of the uterus were seen,  and there were no endometrial polyps or submucosal fibroids noted.  Both  ostia were visualized coming out of the endocervix.  The polyp had been  fully removed.  There was some bleeding at the  area of the polypectomy, which was made hemostatic with Monsel's and silver  nitrate.  All instruments were removed from the vagina.  The tenaculum was  removed with good hemostasis.  Sponge, lap and needle  count were correct x2.  The patient went to the recovery room in stable condition.       NAD/MEDQ  D:  03/21/2005  T:  03/21/2005  Job:  [id number]

## 2011-02-21 NOTE — H&P (Signed)
NAMEMarland [name]  Morgan, [name] NO.:  [id number]   MEDICAL RECORD NO.:  [id number]          PATIENT TYPE:  AMB   LOCATION:  SDC                           FACILITY:  [facility]   PHYSICIAN:  Naima A. Dillard, M.D. DATE OF BIRTH:  Jul 03, 1951   DATE OF ADMISSION:  DATE OF DISCHARGE:                                HISTORY & PHYSICAL   DIAGNOSIS:  Endocervical mass and breakthrough bleeding.   HISTORY OF PRESENT ILLNESS:  The patient is a 60 year old African American  female, gravida 4, para 3, who presented with some breakthrough bleeding  after starting CombiPatch for menopausal symptoms back in March of 2006. The  patient was found to have a polypoid cervical lesion and a biopsy was  significant for a cervical mucus. He tried to do a sonohistogram but it was  unsuccessful secondary to the patient's comfort. Polyp was still seen. The  patient's uterus was significant for several fibroids, the largest being 1.2  x 1.4 cm. The uterus was estimated at 8.3 x 4.8 x 5.0. The ovaries were  within normal limits.   The patient says that since she stopped the CombiPatch she has not had any  further breakthrough bleeding.   PAST MEDICAL HISTORY:  1.  Hypertension.  2.  Asthma.   ALLERGIES:  The patient has no known drug allergies.   MEDICATIONS:  Ultracet, Singulair, Micardis, vitamin, fluid balance, and  Tramadol.   FAMILY HISTORY:  Significant for GYN cancer. The patient quit smoking four  years ago. She occasionally has alcohol use and no illicit drug use.   PAST MEDICAL HISTORY:  Significant for in 1960 she had exploratory  laparotomy and appendectomy. She had a lumbar laminectomy and she had a  bilateral tubal ligation.   PHYSICAL EXAMINATION:  VITAL SIGNS:  The patient's blood pressure is 110/70.  Her weight is 165 pounds.  HEENT:  Pupils are equal. Hearing is normal. Throat is clear.  NECK:  Thyroid is not enlarged.  HEART:  Regular rate and rhythm.  LUNGS:  Clear to  auscultation bilaterally.  BREASTS:  No masses, discharge, skin changes, or nipple retraction.  BACK:  No CVA tenderness bilaterally.  ABDOMEN:  Nontender without any masses or organomegaly.  EXTREMITIES:  No clubbing, cyanosis, or edema.  NEUROLOGICAL:  Within normal limits.  PELVIC:  Vaginal exam is within normal limits. Cervix shows a clear,  polypoid lesion in the endocervical canal. Uterus is normal shape, size,  consistency, and nontender. Adnexa has no masses. Rectovaginal exam is  within normal limits.   LABORATORY DATA:  She had a Pap smear which was within normal limits.   REVIEW OF SYMPTOMS:  CARDIOVASCULAR:  Significant for hypertension.  GENITOURINARY:  As above. GASTROINTESTINAL:  Unremarkable. PSYCHIATRIC:  The  patient is grieving. She has had several deaths in her close family.  ENDOCRINE:  Unremarkable.   ASSESSMENT:  Endocervical polyp and breakthrough bleeding.   PLAN:  D&C hysteroscopy. The patient was unable to tolerate a sonohistogram  in the office. She understands the risks are but not limited to problems  with anesthesia, bleeding, infection, damage  to internal organs such as  bowel or bladder, and major blood vessels, perforation in the uterus,  hysterectomy and pyometra.       NAD/MEDQ  D:  03/20/2005  T:  03/20/2005  Job:  [id number]

## 2011-03-10 ENCOUNTER — Other Ambulatory Visit: Payer: Self-pay | Admitting: Family Medicine

## 2011-03-10 MED ORDER — LISINOPRIL-HYDROCHLOROTHIAZIDE 20-12.5 MG PO TABS: 1.0000 | ORAL_TABLET | Freq: Every day | ORAL | Status: DC

## 2011-03-10 NOTE — Telephone Encounter (Signed)
LETTER MAILED    KP

## 2011-05-05 ENCOUNTER — Other Ambulatory Visit: Payer: Self-pay | Admitting: Family Medicine

## 2011-07-07 LAB — HM COLONOSCOPY

## 2011-07-26 ENCOUNTER — Other Ambulatory Visit: Payer: Self-pay | Admitting: Family Medicine

## 2011-07-31 ENCOUNTER — Telehealth: Payer: Self-pay | Admitting: Family Medicine

## 2011-07-31 ENCOUNTER — Other Ambulatory Visit: Payer: Self-pay | Admitting: Family Medicine

## 2011-07-31 NOTE — Telephone Encounter (Signed)
error 

## 2011-08-01 ENCOUNTER — Other Ambulatory Visit: Payer: Self-pay | Admitting: Family Medicine

## 2011-11-24 ENCOUNTER — Other Ambulatory Visit: Payer: Self-pay | Admitting: Family Medicine

## 2011-12-08 ENCOUNTER — Telehealth: Payer: Self-pay

## 2011-12-08 NOTE — Telephone Encounter (Signed)
Discussed with patient and she was given a 15 day supply of the medication but she stopped it on her own last week because her head was hurting she stated she normally does not get headaches and she also stated the pills did not look the same as they dd before, I asked did she confirm the pharmacy gave her the correct medication and she stated she did not.  She also has not checked her BP because she does not have a BP machine at home. She is coming in tomorrow.     KP

## 2011-12-08 NOTE — Telephone Encounter (Signed)
[facility] Triage Call Report Triage Record Num: [id number] Operator: Geanie Berlin Patient Name: Morgan [name] Call Date & Time: 12/08/2011 1:31:31PM Patient Phone: [phone] PCP: Lelon Perla Patient Gender: Female PCP Fax : [phone] Patient DOB: 11-Jun-1951 Practice Name: [facility] Reason for Call: Caller: Miia/Patient is calling with a question about Lisinopril. MD gave partial refill of Lisinopril 11/24/11 for #15; Stopped taking medication 12/04/11 d/t daily headache. Has no insurance. Thinks may be due to come in for appt since > 1 yr since last visit. Asking if can make payments for office visit. No current headache. No BP machine. Reports less energy, does not feel like self. No nosebleeds. LMP/menopausal. Advised to see MD within 24 hrs for suspected sudden elevation of BP and had not been taking BP med per Hypertesion Diagnosed or Suspected Guideline. Transferred to [place] /office r/t needs appt and has payment concerns. Protocol(s) Used: Hypertension, Diagnosed or Suspected Recommended Outcome per Protocol: See Provider within 24 hours Reason for Outcome: A sudden elevation in blood pressure AND has not been taking blood pressure medication as prescribed, or recently started, changed, or stopped taking prescription medication; or recently started taking nonprescription or alternative medicine Care Advice: ~ When speaking with provider, discuss symptoms or reasons for changing or not following treatment plan. ~ Call provider if systolic BP is 180 or greater, or if diastolic BP is 120 or greater. ~ SYMPTOM / CONDITION MANAGEMENT ~ List, or take, all current prescription(s), nonprescription or alternative medication(s) to provider for evaluation. LIFESTYLE MODIFICATION FOR HYPERTENSION: - Weight reduction will help lower blood pressure in individuals who are 10% or more above their ideal body weight. - Eat foods low in saturated fat and sugar,  and high in complex carbohydrates (vegetables, fruits, whole grains). - Drink alcohol only in moderate amounts (12 oz beer, 5 oz wine, or 1 1/2 oz of distilled alcohol such as vodka, gin, etc.) and not more than 2 drinks/day for men or 1 drink/day for women or lighter-weight persons. - Get at least 30 minutes of moderate aerobic exercise (using as much energy as walking 2 miles in 30 minutes) most days of the week, preferably daily. - Stop smoking to decrease risk for cardiovascular and pulmonary disease, as well as cancer. - Keep regularly scheduled appointments with your provider. ~ Medication Advice: - Discontinue all nonprescription and alternative medications, especially stimulants, until evaluated by provider. - Take prescribed medications as directed, following label instructions for the medication. - Do not change medications or dosing regimen until provider is consulted. - Know possible side effects of medication and what to do if they occur. - Tell provider all prescription, nonprescription or alternative medications that you take ~ 03/04/  **PATIENT WITH PENDING APPOINTMENT TOMORROW**

## 2011-12-09 ENCOUNTER — Ambulatory Visit (INDEPENDENT_AMBULATORY_CARE_PROVIDER_SITE_OTHER): Payer: Self-pay | Admitting: Family Medicine

## 2011-12-09 ENCOUNTER — Encounter: Payer: Self-pay | Admitting: Family Medicine

## 2011-12-09 VITALS — BP 134/88 | HR 76 | Temp 98.8°F | Ht 65.75 in | Wt 184.0 lb

## 2011-12-09 DIAGNOSIS — I10 Essential (primary) hypertension: Secondary | ICD-10-CM

## 2011-12-09 MED ORDER — LISINOPRIL-HYDROCHLOROTHIAZIDE 20-12.5 MG PO TABS: ORAL_TABLET | ORAL | Status: DC

## 2011-12-09 NOTE — Patient Instructions (Signed)

## 2011-12-09 NOTE — Progress Notes (Signed)
  Subjective:    Patient here for follow-up of elevated blood pressure.  She is not exercising and is adherent to a low-salt diet.  Blood pressure is not well controlled at home. Cardiac symptoms: none. Patient denies: chest pain, chest pressure/discomfort, claudication, dyspnea, exertional chest pressure/discomfort, fatigue, irregular heart beat, lower extremity edema, near-syncope, orthopnea, palpitations, paroxysmal nocturnal dyspnea and syncope. Cardiovascular risk factors: hypertension. Use of agents associated with hypertension: none. History of target organ damage: none.  Pt c/o headache and she thought meds were different.  Pills look the same to me but she needs to take them to pharmacy.  Pt stopped med 1 week ago.  The following portions of the patient's history were reviewed and updated as appropriate: allergies, current medications, past family history, past medical history, past social history, past surgical history and problem list.  Review of Systems Pertinent items are noted in HPI.     Objective:    BP 134/88  Pulse 76  Temp(Src) 98.8 F (37.1 C) (Oral)  Ht 5' 5.75" (1.67 m)  Wt 184 lb (83.462 kg)  BMI 29.92 kg/m2  SpO2 98% General appearance: alert, cooperative, appears stated age and no distress Lungs: clear to auscultation bilaterally Heart: S1, S2 normal Extremities: extremities normal, atraumatic, no cyanosis or edema    Assessment:    Hypertension, stage 1 . Evidence of target organ damage: none.    Plan:    Medication: resume lisinopril hct. Dietary sodium restriction. Regular aerobic exercise. Follow up: 3 weeks and as needed. --will check labs then as well

## 2011-12-30 ENCOUNTER — Ambulatory Visit: Payer: Self-pay | Admitting: Family Medicine

## 2012-01-07 ENCOUNTER — Ambulatory Visit: Payer: Self-pay | Admitting: Family Medicine

## 2012-04-09 ENCOUNTER — Other Ambulatory Visit: Payer: Self-pay | Admitting: Family Medicine

## 2012-04-09 NOTE — Telephone Encounter (Signed)
Refill done.  

## 2012-05-14 ENCOUNTER — Ambulatory Visit (INDEPENDENT_AMBULATORY_CARE_PROVIDER_SITE_OTHER): Payer: PRIVATE HEALTH INSURANCE | Admitting: Family Medicine

## 2012-05-14 ENCOUNTER — Encounter: Payer: Self-pay | Admitting: Family Medicine

## 2012-05-14 VITALS — BP 120/68 | HR 70 | Temp 98.7°F | Ht 65.0 in | Wt 182.2 lb

## 2012-05-14 DIAGNOSIS — I10 Essential (primary) hypertension: Secondary | ICD-10-CM

## 2012-05-14 DIAGNOSIS — Z1239 Encounter for other screening for malignant neoplasm of breast: Secondary | ICD-10-CM

## 2012-05-14 DIAGNOSIS — Z1231 Encounter for screening mammogram for malignant neoplasm of breast: Secondary | ICD-10-CM

## 2012-05-14 DIAGNOSIS — Z0000 Encounter for general adult medical examination without abnormal findings: Secondary | ICD-10-CM

## 2012-05-14 DIAGNOSIS — Z23 Encounter for immunization: Secondary | ICD-10-CM

## 2012-05-14 MED ORDER — LISINOPRIL-HYDROCHLOROTHIAZIDE 20-12.5 MG PO TABS: 1.0000 | ORAL_TABLET | Freq: Every day | ORAL | Status: DC

## 2012-05-14 NOTE — Patient Instructions (Addendum)
Preventive Care for Adults, Female A healthy lifestyle and preventive care can promote health and wellness. Preventive health guidelines for women include the following key practices.  A routine yearly physical is a good way to check with your caregiver about your health and preventive screening. It is a chance to share any concerns and updates on your health, and to receive a thorough exam.   Visit your dentist for a routine exam and preventive care every 6 months. Brush your teeth twice a day and floss once a day. Good oral hygiene prevents tooth decay and gum disease.   The frequency of eye exams is based on your age, health, family medical history, use of contact lenses, and other factors. Follow your caregiver's recommendations for frequency of eye exams.   Eat a healthy diet. Foods like vegetables, fruits, whole grains, low-fat dairy products, and lean protein foods contain the nutrients you need without too many calories. Decrease your intake of foods high in solid fats, added sugars, and salt. Eat the right amount of calories for you.Get information about a proper diet from your caregiver, if necessary.   Regular physical exercise is one of the most important things you can do for your health. Most adults should get at least 150 minutes of moderate-intensity exercise (any activity that increases your heart rate and causes you to sweat) each week. In addition, most adults need muscle-strengthening exercises on 2 or more days a week.   Maintain a healthy weight. The body mass index (BMI) is a screening tool to identify possible weight problems. It provides an estimate of body fat based on height and weight. Your caregiver can help determine your BMI, and can help you achieve or maintain a healthy weight.For adults 20 years and older:   A BMI below 18.5 is considered underweight.   A BMI of 18.5 to 24.9 is normal.   A BMI of 25 to 29.9 is considered overweight.   A BMI of 30 and above is  considered obese.   Maintain normal blood lipids and cholesterol levels by exercising and minimizing your intake of saturated fat. Eat a balanced diet with plenty of fruit and vegetables. Blood tests for lipids and cholesterol should begin at age 20 and be repeated every 5 years. If your lipid or cholesterol levels are high, you are over 50, or you are at high risk for heart disease, you may need your cholesterol levels checked more frequently.Ongoing high lipid and cholesterol levels should be treated with medicines if diet and exercise are not effective.   If you smoke, find out from your caregiver how to quit. If you do not use tobacco, do not start.   If you are pregnant, do not drink alcohol. If you are breastfeeding, be very cautious about drinking alcohol. If you are not pregnant and choose to drink alcohol, do not exceed 1 drink per day. One drink is considered to be 12 ounces (355 mL) of beer, 5 ounces (148 mL) of wine, or 1.5 ounces (44 mL) of liquor.   Avoid use of street drugs. Do not share needles with anyone. Ask for help if you need support or instructions about stopping the use of drugs.   High blood pressure causes heart disease and increases the risk of stroke. Your blood pressure should be checked at least every 1 to 2 years. Ongoing high blood pressure should be treated with medicines if weight loss and exercise are not effective.   If you are 55 to 62   years old, ask your caregiver if you should take aspirin to prevent strokes.   Diabetes screening involves taking a blood sample to check your fasting blood sugar level. This should be done once every 3 years, after age 45, if you are within normal weight and without risk factors for diabetes. Testing should be considered at a younger age or be carried out more frequently if you are overweight and have at least 1 risk factor for diabetes.   Breast cancer screening is essential preventive care for women. You should practice "breast  self-awareness." This means understanding the normal appearance and feel of your breasts and may include breast self-examination. Any changes detected, no matter how small, should be reported to a caregiver. Women in their 20s and 30s should have a clinical breast exam (CBE) by a caregiver as part of a regular health exam every 1 to 3 years. After age 40, women should have a CBE every year. Starting at age 40, women should consider having a mammography (breast X-ray test) every year. Women who have a family history of breast cancer should talk to their caregiver about genetic screening. Women at a high risk of breast cancer should talk to their caregivers about having magnetic resonance imaging (MRI) and a mammography every year.   The Pap test is a screening test for cervical cancer. A Pap test can show cell changes on the cervix that might become cervical cancer if left untreated. A Pap test is a procedure in which cells are obtained and examined from the lower end of the uterus (cervix).   Women should have a Pap test starting at age 21.   Between ages 21 and 29, Pap tests should be repeated every 2 years.   Beginning at age 30, you should have a Pap test every 3 years as long as the past 3 Pap tests have been normal.   Some women have medical problems that increase the chance of getting cervical cancer. Talk to your caregiver about these problems. It is especially important to talk to your caregiver if a new problem develops soon after your last Pap test. In these cases, your caregiver may recommend more frequent screening and Pap tests.   The above recommendations are the same for women who have or have not gotten the vaccine for human papillomavirus (HPV).   If you had a hysterectomy for a problem that was not cancer or a condition that could lead to cancer, then you no longer need Pap tests. Even if you no longer need a Pap test, a regular exam is a good idea to make sure no other problems are  starting.   If you are between ages 65 and 70, and you have had normal Pap tests going back 10 years, you no longer need Pap tests. Even if you no longer need a Pap test, a regular exam is a good idea to make sure no other problems are starting.   If you have had past treatment for cervical cancer or a condition that could lead to cancer, you need Pap tests and screening for cancer for at least 20 years after your treatment.   If Pap tests have been discontinued, risk factors (such as a new sexual partner) need to be reassessed to determine if screening should be resumed.   The HPV test is an additional test that may be used for cervical cancer screening. The HPV test looks for the virus that can cause the cell changes on the cervix.   The cells collected during the Pap test can be tested for HPV. The HPV test could be used to screen women aged 30 years and older, and should be used in women of any age who have unclear Pap test results. After the age of 30, women should have HPV testing at the same frequency as a Pap test.   Colorectal cancer can be detected and often prevented. Most routine colorectal cancer screening begins at the age of 50 and continues through age 75. However, your caregiver may recommend screening at an earlier age if you have risk factors for colon cancer. On a yearly basis, your caregiver may provide home test kits to check for hidden blood in the stool. Use of a small camera at the end of a tube, to directly examine the colon (sigmoidoscopy or colonoscopy), can detect the earliest forms of colorectal cancer. Talk to your caregiver about this at age 50, when routine screening begins. Direct examination of the colon should be repeated every 5 to 10 years through age 75, unless early forms of pre-cancerous polyps or small growths are found.   Hepatitis C blood testing is recommended for all people born from 1945 through 1965 and any individual with known risks for hepatitis C.    Practice safe sex. Use condoms and avoid high-risk sexual practices to reduce the spread of sexually transmitted infections (STIs). STIs include gonorrhea, chlamydia, syphilis, trichomonas, herpes, HPV, and human immunodeficiency virus (HIV). Herpes, HIV, and HPV are viral illnesses that have no cure. They can result in disability, cancer, and death. Sexually active women aged 25 and younger should be checked for chlamydia. Older women with new or multiple partners should also be tested for chlamydia. Testing for other STIs is recommended if you are sexually active and at increased risk.   Osteoporosis is a disease in which the bones lose minerals and strength with aging. This can result in serious bone fractures. The risk of osteoporosis can be identified using a bone density scan. Women ages 65 and over and women at risk for fractures or osteoporosis should discuss screening with their caregivers. Ask your caregiver whether you should take a calcium supplement or vitamin D to reduce the rate of osteoporosis.   Menopause can be associated with physical symptoms and risks. Hormone replacement therapy is available to decrease symptoms and risks. You should talk to your caregiver about whether hormone replacement therapy is right for you.   Use sunscreen with sun protection factor (SPF) of 30 or more. Apply sunscreen liberally and repeatedly throughout the day. You should seek shade when your shadow is shorter than you. Protect yourself by wearing long sleeves, pants, a wide-brimmed hat, and sunglasses year round, whenever you are outdoors.   Once a month, do a whole body skin exam, using a mirror to look at the skin on your back. Notify your caregiver of new moles, moles that have irregular borders, moles that are larger than a pencil eraser, or moles that have changed in shape or color.   Stay current with required immunizations.   Influenza. You need a dose every fall (or winter). The composition of  the flu vaccine changes each year, so being vaccinated once is not enough.   Pneumococcal polysaccharide. You need 1 to 2 doses if you smoke cigarettes or if you have certain chronic medical conditions. You need 1 dose at age 65 (or older) if you have never been vaccinated.   Tetanus, diphtheria, pertussis (Tdap, Td). Get 1 dose of   Tdap vaccine if you are younger than age 65, are over 65 and have contact with an infant, are a healthcare worker, are pregnant, or simply want to be protected from whooping cough. After that, you need a Td booster dose every 10 years. Consult your caregiver if you have not had at least 3 tetanus and diphtheria-containing shots sometime in your life or have a deep or dirty wound.   HPV. You need this vaccine if you are a woman age 26 or younger. The vaccine is given in 3 doses over 6 months.   Measles, mumps, rubella (MMR). You need at least 1 dose of MMR if you were born in 1957 or later. You may also need a second dose.   Meningococcal. If you are age 19 to 21 and a first-year college student living in a residence hall, or have one of several medical conditions, you need to get vaccinated against meningococcal disease. You may also need additional booster doses.   Zoster (shingles). If you are age 60 or older, you should get this vaccine.   Varicella (chickenpox). If you have never had chickenpox or you were vaccinated but received only 1 dose, talk to your caregiver to find out if you need this vaccine.   Hepatitis A. You need this vaccine if you have a specific risk factor for hepatitis A virus infection or you simply wish to be protected from this disease. The vaccine is usually given as 2 doses, 6 to 18 months apart.   Hepatitis B. You need this vaccine if you have a specific risk factor for hepatitis B virus infection or you simply wish to be protected from this disease. The vaccine is given in 3 doses, usually over 6 months.  Preventive Services /  Frequency Ages 19 to 39  Blood pressure check.** / Every 1 to 2 years.   Lipid and cholesterol check.** / Every 5 years beginning at age 20.   Clinical breast exam.** / Every 3 years for women in their 20s and 30s.   Pap test.** / Every 2 years from ages 21 through 29. Every 3 years starting at age 30 through age 65 or 70 with a history of 3 consecutive normal Pap tests.   HPV screening.** / Every 3 years from ages 30 through ages 65 to 70 with a history of 3 consecutive normal Pap tests.   Hepatitis C blood test.** / For any individual with known risks for hepatitis C.   Skin self-exam. / Monthly.   Influenza immunization.** / Every year.   Pneumococcal polysaccharide immunization.** / 1 to 2 doses if you smoke cigarettes or if you have certain chronic medical conditions.   Tetanus, diphtheria, pertussis (Tdap, Td) immunization. / A one-time dose of Tdap vaccine. After that, you need a Td booster dose every 10 years.   HPV immunization. / 3 doses over 6 months, if you are 26 and younger.   Measles, mumps, rubella (MMR) immunization. / You need at least 1 dose of MMR if you were born in 1957 or later. You may also need a second dose.   Meningococcal immunization. / 1 dose if you are age 19 to 21 and a first-year college student living in a residence hall, or have one of several medical conditions, you need to get vaccinated against meningococcal disease. You may also need additional booster doses.   Varicella immunization.** / Consult your caregiver.   Hepatitis A immunization.** / Consult your caregiver. 2 doses, 6 to 18 months   apart.   Hepatitis B immunization.** / Consult your caregiver. 3 doses usually over 6 months.  Ages 40 to 64  Blood pressure check.** / Every 1 to 2 years.   Lipid and cholesterol check.** / Every 5 years beginning at age 20.   Clinical breast exam.** / Every year after age 40.   Mammogram.** / Every year beginning at age 40 and continuing for as  long as you are in good health. Consult with your caregiver.   Pap test.** / Every 3 years starting at age 30 through age 65 or 70 with a history of 3 consecutive normal Pap tests.   HPV screening.** / Every 3 years from ages 30 through ages 65 to 70 with a history of 3 consecutive normal Pap tests.   Fecal occult blood test (FOBT) of stool. / Every year beginning at age 50 and continuing until age 75. You may not need to do this test if you get a colonoscopy every 10 years.   Flexible sigmoidoscopy or colonoscopy.** / Every 5 years for a flexible sigmoidoscopy or every 10 years for a colonoscopy beginning at age 50 and continuing until age 75.   Hepatitis C blood test.** / For all people born from 1945 through 1965 and any individual with known risks for hepatitis C.   Skin self-exam. / Monthly.   Influenza immunization.** / Every year.   Pneumococcal polysaccharide immunization.** / 1 to 2 doses if you smoke cigarettes or if you have certain chronic medical conditions.   Tetanus, diphtheria, pertussis (Tdap, Td) immunization.** / A one-time dose of Tdap vaccine. After that, you need a Td booster dose every 10 years.   Measles, mumps, rubella (MMR) immunization. / You need at least 1 dose of MMR if you were born in 1957 or later. You may also need a second dose.   Varicella immunization.** / Consult your caregiver.   Meningococcal immunization.** / Consult your caregiver.   Hepatitis A immunization.** / Consult your caregiver. 2 doses, 6 to 18 months apart.   Hepatitis B immunization.** / Consult your caregiver. 3 doses, usually over 6 months.  Ages 65 and over  Blood pressure check.** / Every 1 to 2 years.   Lipid and cholesterol check.** / Every 5 years beginning at age 20.   Clinical breast exam.** / Every year after age 40.   Mammogram.** / Every year beginning at age 40 and continuing for as long as you are in good health. Consult with your caregiver.   Pap test.** /  Every 3 years starting at age 30 through age 65 or 70 with a 3 consecutive normal Pap tests. Testing can be stopped between 65 and 70 with 3 consecutive normal Pap tests and no abnormal Pap or HPV tests in the past 10 years.   HPV screening.** / Every 3 years from ages 30 through ages 65 or 70 with a history of 3 consecutive normal Pap tests. Testing can be stopped between 65 and 70 with 3 consecutive normal Pap tests and no abnormal Pap or HPV tests in the past 10 years.   Fecal occult blood test (FOBT) of stool. / Every year beginning at age 50 and continuing until age 75. You may not need to do this test if you get a colonoscopy every 10 years.   Flexible sigmoidoscopy or colonoscopy.** / Every 5 years for a flexible sigmoidoscopy or every 10 years for a colonoscopy beginning at age 50 and continuing until age 75.   Hepatitis   C blood test.** / For all people born from 1945 through 1965 and any individual with known risks for hepatitis C.   Osteoporosis screening.** / A one-time screening for women ages 65 and over and women at risk for fractures or osteoporosis.   Skin self-exam. / Monthly.   Influenza immunization.** / Every year.   Pneumococcal polysaccharide immunization.** / 1 dose at age 65 (or older) if you have never been vaccinated.   Tetanus, diphtheria, pertussis (Tdap, Td) immunization. / A one-time dose of Tdap vaccine if you are over 65 and have contact with an infant, are a healthcare worker, or simply want to be protected from whooping cough. After that, you need a Td booster dose every 10 years.   Varicella immunization.** / Consult your caregiver.   Meningococcal immunization.** / Consult your caregiver.   Hepatitis A immunization.** / Consult your caregiver. 2 doses, 6 to 18 months apart.   Hepatitis B immunization.** / Check with your caregiver. 3 doses, usually over 6 months.  ** Family history and personal history of risk and conditions may change your caregiver's  recommendations. Document Released: 11/18/2001 Document Revised: 09/11/2011 Document Reviewed: 02/17/2011 [employer] Patient Information 2012 [employer]. 

## 2012-05-14 NOTE — Assessment & Plan Note (Signed)
Stable con't meds 

## 2012-05-14 NOTE — Progress Notes (Signed)
  Subjective:     Morgan [name] is a 61 y.o. female and is here for a comprehensive physical exam. The patient reports no problems.  History   Social History  . Marital Status: Married    Spouse Name: N/A    Number of Children: N/A  . Years of Education: N/A   Occupational History  . Not on file.   Social History Main Topics  . Smoking status: Never Smoker   . Smokeless tobacco: Never Used  . Alcohol Use: No  . Drug Use: No  . Sexually Active: Not on file   Other Topics Concern  . Not on file   Social History Narrative  . No narrative on file   Health Maintenance  Topic Date Due  . Tetanus/tdap  03/30/1969  . Mammogram  06/13/2009  . Zostavax  03/30/2010  . Influenza Vaccine  07/06/2012  . Pap Smear  05/06/2014  . Colonoscopy  07/06/2021    The following portions of the patient's history were reviewed and updated as appropriate: allergies, current medications, past family history, past medical history, past social history, past surgical history and problem list.  Review of Systems Review of Systems  Constitutional: Negative for activity change, appetite change and fatigue.  HENT: Negative for hearing loss, congestion, tinnitus and ear discharge.  dentist due Eyes: Negative for visual disturbance (see optho q2y -- vision corrected to 20/20 with glasses).  Respiratory: Negative for cough, chest tightness and shortness of breath.   Cardiovascular: Negative for chest pain, palpitations and leg swelling.  Gastrointestinal: Negative for abdominal pain, diarrhea, constipation and abdominal distention.  Genitourinary: Negative for urgency, frequency, decreased urine volume and difficulty urinating.  Musculoskeletal: Negative for back pain, arthralgias and gait problem.  Skin: Negative for color change, pallor and rash.  Neurological: Negative for dizziness, light-headedness, numbness and headaches.  Hematological: Negative for adenopathy. Does not bruise/bleed easily.    Psychiatric/Behavioral: Negative for suicidal ideas, confusion, sleep disturbance, self-injury, dysphoric mood, decreased concentration and agitation.       Objective:    BP 120/68  Pulse 70  Temp 98.7 F (37.1 C) (Oral)  Ht 5\' 5"  (1.651 m)  Wt 182 lb 3.2 oz (82.645 kg)  BMI 30.32 kg/m2  SpO2 98% General appearance: alert, cooperative, appears stated age and no distress Head: Normocephalic, without obvious abnormality, atraumatic Eyes: conjunctivae/corneas clear. PERRL, EOM's intact. Fundi benign. Ears: normal TM's and external ear canals both ears Nose: Nares normal. Septum midline. Mucosa normal. No drainage or sinus tenderness. Throat: lips, mucosa, and tongue normal; teeth and gums normal Neck: no adenopathy, no carotid bruit, no JVD, supple, symmetrical, trachea midline and thyroid not enlarged, symmetric, no tenderness/mass/nodules Back: symmetric, no curvature. ROM normal. No CVA tenderness. Lungs: clear to auscultation bilaterally Breasts: gyn Heart: regular rate and rhythm, S1, S2 normal, no murmur, click, rub or gallop Abdomen: soft, non-tender; bowel sounds normal; no masses,  no organomegaly Pelvic: deferred--gyn Extremities: extremities normal, atraumatic, no cyanosis or edema Pulses: 2+ and symmetric Skin: Skin color, texture, turgor normal. No rashes or lesions Lymph nodes: Cervical, supraclavicular, and axillary nodes normal. Neurologic: Alert and oriented X 3, normal strength and tone. Normal symmetric reflexes. Normal coordination and gait psych-- no depression, anxiety    Assessment:    Healthy female exam.      Plan:    ghm utd Check labs See After Visit Summary for Counseling Recommendations

## 2012-05-17 LAB — TB SKIN TEST
Induration: 0 mm
TB Skin Test: NEGATIVE

## 2012-05-28 ENCOUNTER — Other Ambulatory Visit: Payer: Self-pay | Admitting: Family Medicine

## 2012-05-28 MED ORDER — TRAMADOL HCL 50 MG PO TABS: 50.0000 mg | ORAL_TABLET | Freq: Four times a day (QID) | ORAL | Status: DC | PRN

## 2012-05-28 NOTE — Telephone Encounter (Signed)
Patient requesting Tramadol. Last seen 05/14/12 she normally sees pain management. Please advise but she is not gong to continue to go due to 150 dollar co pay. Please advise          KP

## 2012-05-28 NOTE — Telephone Encounter (Signed)
Caller: Morgan [name]/Patient; Phone: [phone]; Reason for Call: Pt requests office call [facility] for her records (she could not be seen there as she did not have $150); she needs refill on Tramadol 50mg .  Please call her to advise when this has been taken care of.

## 2012-05-28 NOTE — Telephone Encounter (Signed)
Refill x1 

## 2012-05-28 NOTE — Telephone Encounter (Signed)
VM left advising the patient that the Rx has been sent.       KP

## 2012-06-04 ENCOUNTER — Other Ambulatory Visit (INDEPENDENT_AMBULATORY_CARE_PROVIDER_SITE_OTHER): Payer: PRIVATE HEALTH INSURANCE

## 2012-06-04 DIAGNOSIS — I10 Essential (primary) hypertension: Secondary | ICD-10-CM

## 2012-06-04 DIAGNOSIS — Z0000 Encounter for general adult medical examination without abnormal findings: Secondary | ICD-10-CM

## 2012-06-04 LAB — URINALYSIS, ROUTINE W REFLEX MICROSCOPIC
Hgb urine dipstick: NEGATIVE
Nitrite: NEGATIVE
Urobilinogen, UA: 0.2 (ref 0.0–1.0)

## 2012-06-04 LAB — CBC WITH DIFFERENTIAL/PLATELET
Basophils Relative: 0.6 % (ref 0.0–3.0)
Eosinophils Relative: 1.2 % (ref 0.0–5.0)
Hemoglobin: 10.9 g/dL — ABNORMAL LOW (ref 12.0–15.0)
Lymphocytes Relative: 53.7 % — ABNORMAL HIGH (ref 12.0–46.0)
MCV: 81.3 fl (ref 78.0–100.0)
Monocytes Absolute: 0.4 10*3/uL (ref 0.1–1.0)
Neutrophils Relative %: 36.2 % — ABNORMAL LOW (ref 43.0–77.0)
RBC: 3.97 Mil/uL (ref 3.87–5.11)
WBC: 5 10*3/uL (ref 4.5–10.5)

## 2012-06-04 LAB — HEPATIC FUNCTION PANEL
Albumin: 3.7 g/dL (ref 3.5–5.2)
Total Protein: 6.6 g/dL (ref 6.0–8.3)

## 2012-06-04 LAB — BASIC METABOLIC PANEL
Chloride: 108 mEq/L (ref 96–112)
Creatinine, Ser: 0.7 mg/dL (ref 0.4–1.2)
Sodium: 142 mEq/L (ref 135–145)

## 2012-06-04 LAB — LIPID PANEL
Cholesterol: 145 mg/dL (ref 0–200)
LDL Cholesterol: 74 mg/dL (ref 0–99)
Triglycerides: 73 mg/dL (ref 0.0–149.0)
VLDL: 14.6 mg/dL (ref 0.0–40.0)

## 2012-06-04 NOTE — Progress Notes (Signed)
Labs only

## 2012-06-08 ENCOUNTER — Other Ambulatory Visit: Payer: Self-pay | Admitting: *Deleted

## 2012-06-08 LAB — VARICELLA ZOSTER ANTIBODY, IGG: Varicella IgG: 5.53 {ISR} — ABNORMAL HIGH (ref <0.90)

## 2012-06-08 MED ORDER — TRAMADOL HCL 50 MG PO TABS: 50.0000 mg | ORAL_TABLET | Freq: Four times a day (QID) | ORAL | Status: DC | PRN

## 2012-06-08 NOTE — Telephone Encounter (Signed)
Last OV 05-14-12, last filled 05-28-12 #30.Marland KitchenPlease advise

## 2012-06-08 NOTE — Telephone Encounter (Signed)
Refill #90 

## 2012-06-10 ENCOUNTER — Ambulatory Visit: Payer: PRIVATE HEALTH INSURANCE

## 2012-06-29 ENCOUNTER — Other Ambulatory Visit: Payer: Self-pay | Admitting: Family Medicine

## 2012-06-29 NOTE — Telephone Encounter (Signed)
Refill Trama-DOL HCL 50mg  Tab #90-take one tablet by mouth every 6-hours as needed -- last fill 9.3.13 #90 LAST OV 8.9.13 cpe

## 2012-06-29 NOTE — Telephone Encounter (Signed)
Discussed with pharmacy and the Rx was received, she will go ahead and fill it.    KP

## 2012-07-01 ENCOUNTER — Other Ambulatory Visit: Payer: Self-pay | Admitting: Family Medicine

## 2012-07-01 NOTE — Telephone Encounter (Signed)
Rx sent pt advised.       MW 

## 2012-07-05 ENCOUNTER — Other Ambulatory Visit (INDEPENDENT_AMBULATORY_CARE_PROVIDER_SITE_OTHER): Payer: PRIVATE HEALTH INSURANCE

## 2012-07-05 DIAGNOSIS — D649 Anemia, unspecified: Secondary | ICD-10-CM

## 2012-07-06 LAB — IBC PANEL
Iron: 74 ug/dL (ref 42–145)
Saturation Ratios: 23.8 % (ref 20.0–50.0)

## 2012-07-06 LAB — CBC WITH DIFFERENTIAL/PLATELET
Eosinophils Relative: 1 % (ref 0.0–5.0)
HCT: 35.4 % — ABNORMAL LOW (ref 36.0–46.0)
Hemoglobin: 11.8 g/dL — ABNORMAL LOW (ref 12.0–15.0)
Lymphocytes Relative: 47.7 % — ABNORMAL HIGH (ref 12.0–46.0)
Lymphs Abs: 2.3 10*3/uL (ref 0.7–4.0)
Monocytes Relative: 5.9 % (ref 3.0–12.0)
Neutro Abs: 2.2 10*3/uL (ref 1.4–7.7)
Platelets: 223 10*3/uL (ref 150.0–400.0)
WBC: 4.8 10*3/uL (ref 4.5–10.5)

## 2012-07-06 LAB — FERRITIN: Ferritin: 76.3 ng/mL (ref 10.0–291.0)

## 2012-07-20 ENCOUNTER — Other Ambulatory Visit: Payer: Self-pay

## 2012-07-20 MED ORDER — TRAMADOL HCL 50 MG PO TABS: 50.0000 mg | ORAL_TABLET | Freq: Four times a day (QID) | ORAL | Status: DC | PRN

## 2012-07-20 NOTE — Telephone Encounter (Signed)
OV 05/24/12 Last filled 07/01/12 #90 no refills but I see 2Rx for this med other one filled 06/11/12.   PLz advise.    MW

## 2012-11-12 ENCOUNTER — Telehealth: Payer: Self-pay | Admitting: Family Medicine

## 2012-11-12 ENCOUNTER — Other Ambulatory Visit: Payer: Self-pay | Admitting: Family Medicine

## 2012-11-12 NOTE — Telephone Encounter (Signed)
Ok to refill x 1  

## 2012-11-12 NOTE — Telephone Encounter (Signed)
Patient called to verify that Dr. Laury Axon had ordered last refill of Tramadol 50 mg.  There was some confusion with pharmacy as to not having record of prescription.  RN verified that last prescription called in to preferred pharmacy was noted on 07/20/12 by Dr. Laury Axon as well as another by her in September 2013.  Pharmacy had an old prescription by her previous provider who that was no longer in effect.  Pharmacy has sent a request for refill.  Patient only has 2 left.  Last appointment was 05/14/12

## 2012-11-12 NOTE — Telephone Encounter (Signed)
See refill encounter

## 2012-11-12 NOTE — Telephone Encounter (Signed)
Last seen 05/14/12 and filled 07/20/12 #90. Please advise    KP

## 2012-11-29 ENCOUNTER — Other Ambulatory Visit: Payer: Self-pay | Admitting: Family Medicine

## 2012-12-03 ENCOUNTER — Other Ambulatory Visit: Payer: Self-pay | Admitting: Family Medicine

## 2012-12-03 NOTE — Telephone Encounter (Signed)
Last seen 05/14/12 and filled 11/12/12 #90.   Please advise     KP

## 2012-12-23 ENCOUNTER — Other Ambulatory Visit: Payer: Self-pay | Admitting: Family Medicine

## 2012-12-23 NOTE — Telephone Encounter (Signed)
Last OV 05-14-12, last filled 07-20-12 #90

## 2013-01-11 ENCOUNTER — Other Ambulatory Visit: Payer: Self-pay | Admitting: Family Medicine

## 2013-01-12 NOTE — Telephone Encounter (Signed)
Last seen  05/14/12 and filled 12/23/12 #90. Please advise     KP

## 2013-01-28 ENCOUNTER — Other Ambulatory Visit: Payer: Self-pay | Admitting: Family Medicine

## 2013-01-28 NOTE — Telephone Encounter (Signed)
Pt called again to see if Tramadol is ok to fill.

## 2013-02-18 ENCOUNTER — Other Ambulatory Visit: Payer: Self-pay | Admitting: Family Medicine

## 2013-02-18 NOTE — Telephone Encounter (Signed)
Last seen 05/14/12 and filled 07/20/12 #90. Please advise    KP

## 2013-03-07 ENCOUNTER — Other Ambulatory Visit: Payer: Self-pay | Admitting: Family Medicine

## 2013-03-07 NOTE — Telephone Encounter (Signed)
Last seen 05/14/12 and filled 02/18/13 #90. Please advise    KP

## 2013-03-09 ENCOUNTER — Telehealth: Payer: Self-pay | Admitting: Family Medicine

## 2013-03-09 ENCOUNTER — Encounter: Payer: Self-pay | Admitting: Lab

## 2013-03-09 ENCOUNTER — Other Ambulatory Visit: Payer: Self-pay | Admitting: Family Medicine

## 2013-03-09 MED ORDER — TRAMADOL HCL 50 MG PO TABS: 100.0000 mg | ORAL_TABLET | Freq: Four times a day (QID) | ORAL | Status: DC | PRN

## 2013-03-09 NOTE — Telephone Encounter (Signed)
Patient states she is taking 2 50MG  tablets every 6 hours but her Rx is written for 1 tablet every six hours so that is why she is already out of her medicine.

## 2013-03-09 NOTE — Telephone Encounter (Signed)
Rx has been the same since prescribed 05/2012. Please advise     KP

## 2013-03-09 NOTE — Telephone Encounter (Signed)
She is correct --- she originally had 2 tab q6 hours-- ok to change rx

## 2013-03-09 NOTE — Telephone Encounter (Signed)
Morgan [name] is calling about her Tramadol; she says her rx is wrong and that is why she is running out of her meds; says her original rx was for 50mg  2tabs q6h prn so that is how she was taking her pills; says she took 200mg  once daily when at the pain clinic and that is why she thought she had went back to taking 2tabs q6h; says she usually takes 2tabs in the am and 2tabs in the pm which equals 200mg ; has OV 03/10/13

## 2013-03-10 ENCOUNTER — Ambulatory Visit (INDEPENDENT_AMBULATORY_CARE_PROVIDER_SITE_OTHER): Payer: BC Managed Care – PPO | Admitting: Family Medicine

## 2013-03-10 ENCOUNTER — Encounter: Payer: Self-pay | Admitting: Family Medicine

## 2013-03-10 VITALS — BP 120/74 | HR 77 | Temp 98.9°F | Wt 184.2 lb

## 2013-03-10 DIAGNOSIS — M545 Low back pain, unspecified: Secondary | ICD-10-CM

## 2013-03-10 DIAGNOSIS — M549 Dorsalgia, unspecified: Secondary | ICD-10-CM

## 2013-03-10 MED ORDER — KETOROLAC TROMETHAMINE 60 MG/2ML IM SOLN
60.0000 mg | Freq: Once | INTRAMUSCULAR | Status: AC
  Administered 2013-03-10: 60 mg via INTRAMUSCULAR

## 2013-03-10 NOTE — Assessment & Plan Note (Signed)
Refill ultram rto 6 months

## 2013-03-10 NOTE — Progress Notes (Signed)
  Subjective:    Patient ID: Morgan [name], female    DOB: 26-Sep-1951, 62 y.o.   MRN: [id number]  HPI Pt here for med refill. She was out of ultram.  Pt doesn't always need ultram 2 at a time but sometimes needs it.   Review of Systems As above    Objective:   Physical Exam BP 120/74  Pulse 77  Temp(Src) 98.9 F (37.2 C) (Oral)  Wt 184 lb 3.2 oz (83.553 kg)  BMI 30.65 kg/m2  SpO2 97% General appearance: alert, cooperative, appears stated age and no distress Extremities: extremities normal, atraumatic, no cyanosis or edema        Assessment & Plan:

## 2013-04-19 ENCOUNTER — Telehealth: Payer: Self-pay | Admitting: *Deleted

## 2013-04-19 NOTE — Telephone Encounter (Signed)
Pt left VM that she will be leaving for [place] and [place] for about a month. Pt indicated that she may need refills while she was gone but she wanted to let you know just in case you received request from out of state pharmacy.

## 2013-04-19 NOTE — Telephone Encounter (Signed)
ok 

## 2013-05-09 ENCOUNTER — Telehealth: Payer: Self-pay | Admitting: Family Medicine

## 2013-05-09 MED ORDER — TRAMADOL HCL 50 MG PO TABS: 100.0000 mg | ORAL_TABLET | Freq: Four times a day (QID) | ORAL | Status: DC | PRN

## 2013-05-09 NOTE — Telephone Encounter (Signed)
Ok for #60- will not send higher quantity across state lines

## 2013-05-09 NOTE — Telephone Encounter (Signed)
Patient left vm on triage line stating she needs rx for tramadol [facility] [place] [place] on [address]

## 2013-05-09 NOTE — Telephone Encounter (Signed)
Last seen 03/10/13 and filled 03/09/13 #270. Patient wants Rx sent to [place]. NO UDS or contract on file      KP

## 2013-05-23 ENCOUNTER — Telehealth: Payer: Self-pay | Admitting: *Deleted

## 2013-05-23 DIAGNOSIS — G894 Chronic pain syndrome: Secondary | ICD-10-CM

## 2013-05-23 MED ORDER — TRAMADOL HCL 50 MG PO TABS: 100.0000 mg | ORAL_TABLET | Freq: Four times a day (QID) | ORAL | Status: DC | PRN

## 2013-05-23 NOTE — Telephone Encounter (Signed)
RX printed 

## 2013-05-23 NOTE — Telephone Encounter (Signed)
Ok to refill x 1  

## 2013-05-23 NOTE — Telephone Encounter (Signed)
Patient is requesting refill on tramadol. Last OV 03/09/13 Last filled on 05/09/13 # 60, sent to pharmacy in [place] while pt on vacation (Filled also on 03/09/13 #270) No UDS or Contract on file  Okay to refill?

## 2013-05-24 MED ORDER — TRAMADOL HCL 50 MG PO TABS: 100.0000 mg | ORAL_TABLET | Freq: Four times a day (QID) | ORAL | Status: DC | PRN

## 2013-05-24 NOTE — Telephone Encounter (Signed)
RX was sent to the wrong pharmacy in [place]. Patient needs this rx sent to [facility] here in [place]

## 2013-05-24 NOTE — Addendum Note (Signed)
Addended by: Maurice Small on: 05/24/2013 03:12 PM   Modules accepted: Orders

## 2013-07-06 ENCOUNTER — Other Ambulatory Visit: Payer: Self-pay | Admitting: Family Medicine

## 2013-07-29 ENCOUNTER — Other Ambulatory Visit: Payer: Self-pay | Admitting: Family Medicine

## 2013-08-01 ENCOUNTER — Telehealth: Payer: Self-pay | Admitting: *Deleted

## 2013-08-01 NOTE — Telephone Encounter (Signed)
Last seen 03/10/13 and filled 05/24/13 #270. Please advise      KP

## 2013-08-01 NOTE — Telephone Encounter (Signed)
Patient called about her refill for MADol (ULTRAM) 50 MG tablet and wanted to know why it has not been refilled yet. thanks

## 2013-08-03 NOTE — Telephone Encounter (Signed)
error 

## 2013-08-10 ENCOUNTER — Other Ambulatory Visit: Payer: Self-pay | Admitting: Obstetrics and Gynecology

## 2013-08-10 ENCOUNTER — Other Ambulatory Visit: Payer: Self-pay

## 2013-08-10 DIAGNOSIS — Z1231 Encounter for screening mammogram for malignant neoplasm of breast: Secondary | ICD-10-CM

## 2013-08-30 ENCOUNTER — Ambulatory Visit
Admission: RE | Admit: 2013-08-30 | Discharge: 2013-08-30 | Disposition: A | Discharge status: Home / Self Care | Payer: BC Managed Care – PPO | Source: Ambulatory Visit

## 2013-08-30 DIAGNOSIS — Z1231 Encounter for screening mammogram for malignant neoplasm of breast: Secondary | ICD-10-CM

## 2013-09-28 ENCOUNTER — Telehealth: Payer: Self-pay | Admitting: *Deleted

## 2013-09-28 NOTE — Telephone Encounter (Signed)
Patient is requesting refill for tramadol.  Last seen-03/10/2013  Last filled-07/29/2013  UDS-not on file, contract not signed   Please advise. SW

## 2013-09-30 MED ORDER — TRAMADOL HCL 50 MG PO TABS: ORAL_TABLET | ORAL | Status: DC

## 2013-09-30 NOTE — Telephone Encounter (Signed)
Med filled and contract printed. Patient notified

## 2013-09-30 NOTE — Telephone Encounter (Signed)
Ok for #30, no refills.  Needs agreement and UDS

## 2013-10-07 ENCOUNTER — Telehealth: Payer: Self-pay | Admitting: *Deleted

## 2013-10-07 NOTE — Telephone Encounter (Signed)
Patient called and stated that she has been taking her medication and putting them in a different container. Patient states that she actually has  more pills than what she thought and currently does not need a refill at this time. SW

## 2013-10-07 NOTE — Telephone Encounter (Signed)
Patient requested a refill for Tramadol on 12/26 which was filled for #30 0 refills. Patient states that she takes 2 in the am and 2 in the pm. Patient is requesting another refill because she wasn't given the correct amount of pills. Patient states that she does take the medication on regular basis and that's why she is requesting a refill. Please advise. SW

## 2013-10-07 NOTE — Telephone Encounter (Signed)
Error

## 2013-10-07 NOTE — Telephone Encounter (Signed)
noted 

## 2013-10-11 ENCOUNTER — Other Ambulatory Visit: Payer: Self-pay | Admitting: Family Medicine

## 2013-10-13 ENCOUNTER — Other Ambulatory Visit: Payer: Self-pay | Admitting: Family Medicine

## 2013-10-13 ENCOUNTER — Telehealth: Payer: Self-pay | Admitting: *Deleted

## 2013-10-13 NOTE — Telephone Encounter (Signed)
Patient states that she was calling to check the status of the refill. Rx was sent to pharmacy on 10/11/2013. Left detailed message on patient's voicemail stating to check with her pharmacy.

## 2013-10-13 NOTE — Telephone Encounter (Signed)
Last seen 03/10/13 and filled 09/30/13 #30 No UDS in file. No contract  Please advise      KP

## 2013-10-14 NOTE — Telephone Encounter (Signed)
Called and told pharmacy this was filled on 10/11/12

## 2013-10-20 ENCOUNTER — Encounter: Payer: Self-pay | Admitting: Family Medicine

## 2013-10-20 ENCOUNTER — Other Ambulatory Visit: Payer: Self-pay | Admitting: Family Medicine

## 2013-10-20 ENCOUNTER — Ambulatory Visit (INDEPENDENT_AMBULATORY_CARE_PROVIDER_SITE_OTHER): Payer: BC Managed Care – PPO | Admitting: Family Medicine

## 2013-10-20 VITALS — BP 118/76 | HR 67 | Temp 98.6°F | Wt 187.4 lb

## 2013-10-20 DIAGNOSIS — M545 Low back pain, unspecified: Secondary | ICD-10-CM

## 2013-10-20 DIAGNOSIS — I10 Essential (primary) hypertension: Secondary | ICD-10-CM

## 2013-10-20 DIAGNOSIS — M1990 Unspecified osteoarthritis, unspecified site: Secondary | ICD-10-CM

## 2013-10-20 DIAGNOSIS — Z2911 Encounter for prophylactic immunotherapy for respiratory syncytial virus (RSV): Secondary | ICD-10-CM

## 2013-10-20 DIAGNOSIS — Z23 Encounter for immunization: Secondary | ICD-10-CM

## 2013-10-20 MED ORDER — LISINOPRIL-HYDROCHLOROTHIAZIDE 20-12.5 MG PO TABS: ORAL_TABLET | ORAL | Status: DC

## 2013-10-20 MED ORDER — TRAMADOL HCL 50 MG PO TABS: ORAL_TABLET | ORAL | Status: DC

## 2013-10-20 NOTE — Patient Instructions (Signed)

## 2013-10-20 NOTE — Progress Notes (Signed)
  Subjective:    Patient here for follow-up of elevated blood pressure.  She is exercising and is adherent to a low-salt diet.  Blood pressure is well controlled at home. Cardiac symptoms: none. Patient denies: chest pain, chest pressure/discomfort, claudication, dyspnea, exertional chest pressure/discomfort, fatigue, irregular heart beat, lower extremity edema, near-syncope, orthopnea, palpitations, paroxysmal nocturnal dyspnea, syncope and tachypnea. Cardiovascular risk factors: hypertension. Use of agents associated with hypertension: none. History of target organ damage: none.  The following portions of the patient's history were reviewed and updated as appropriate: allergies, current medications, past family history, past medical history, past social history, past surgical history and problem list.  Review of Systems Pertinent items are noted in HPI.     Objective:    BP 118/76  Pulse 67  Temp(Src) 98.6 F (37 C) (Oral)  Wt 187 lb 6.4 oz (85.004 kg)  SpO2 99% General appearance: alert, cooperative, appears stated age and no distress Throat: lips, mucosa, and tongue normal; teeth and gums normal Neck: no adenopathy, no carotid bruit, no JVD, supple, symmetrical, trachea midline and thyroid not enlarged, symmetric, no tenderness/mass/nodules Lungs: clear to auscultation bilaterally Heart: regular rate and rhythm, S1, S2 normal, no murmur, click, rub or gallop Extremities: extremities normal, atraumatic, no cyanosis or edema    Assessment:    Hypertension, normal blood pressure . Evidence of target organ damage: none.    Plan:    Medication: no change. Dietary sodium restriction. Regular aerobic exercise. Check blood pressures weekly and record. Follow up: 6 months and as needed.

## 2013-10-20 NOTE — Assessment & Plan Note (Signed)
Refill meds rto 6 months 

## 2013-10-20 NOTE — Progress Notes (Signed)
Pre visit review using our clinic review tool, if applicable. No additional management support is needed unless otherwise documented below in the visit note. 

## 2013-11-03 ENCOUNTER — Ambulatory Visit: Payer: Self-pay

## 2013-11-08 ENCOUNTER — Ambulatory Visit: Payer: Self-pay

## 2013-11-09 ENCOUNTER — Telehealth: Payer: Self-pay | Admitting: Family Medicine

## 2013-11-09 NOTE — Telephone Encounter (Signed)
Relevant patient education assigned to patient using Emmi. ° °

## 2013-11-23 ENCOUNTER — Telehealth: Payer: Self-pay | Admitting: General Practice

## 2013-11-23 DIAGNOSIS — M1990 Unspecified osteoarthritis, unspecified site: Secondary | ICD-10-CM

## 2013-11-23 MED ORDER — TRAMADOL HCL 50 MG PO TABS: ORAL_TABLET | ORAL | Status: DC

## 2013-11-23 NOTE — Telephone Encounter (Signed)
Refill x1 

## 2013-11-23 NOTE — Telephone Encounter (Signed)
Med filled, will notify pt.  

## 2013-11-23 NOTE — Telephone Encounter (Signed)
Tramadol last filled 10-20-13 Last OV 10/20/13

## 2013-12-09 ENCOUNTER — Encounter: Payer: Self-pay | Admitting: Family Medicine

## 2013-12-26 ENCOUNTER — Other Ambulatory Visit: Payer: Self-pay | Admitting: Family Medicine

## 2013-12-26 NOTE — Telephone Encounter (Signed)
Last seen 10/20/13 and filled 11/23/13 #120. Please advise     KP

## 2013-12-27 ENCOUNTER — Telehealth: Payer: Self-pay | Admitting: *Deleted

## 2013-12-27 NOTE — Telephone Encounter (Signed)
Tramadol prescription faxed to [facility]. Patient aware. JG//CMA

## 2014-01-18 ENCOUNTER — Telehealth: Payer: Self-pay | Admitting: Family Medicine

## 2014-01-18 NOTE — Telephone Encounter (Signed)
Patient called and requested a refill for traMADol (ULTRAM) 50 MG tablet

## 2014-01-18 NOTE — Telephone Encounter (Signed)
Last seen 10/20/13 and filled 12/26/13 #120.  PLease advise     KP

## 2014-01-19 NOTE — Telephone Encounter (Signed)
Left a message for call back.  

## 2014-01-19 NOTE — Telephone Encounter (Signed)
She is calling a week early-- if she is just giving us several days notice its ok to fill----but she should not be out

## 2014-01-22 ENCOUNTER — Other Ambulatory Visit: Payer: Self-pay | Admitting: Family Medicine

## 2014-01-23 ENCOUNTER — Encounter: Payer: Self-pay | Admitting: Family Medicine

## 2014-01-23 ENCOUNTER — Ambulatory Visit (INDEPENDENT_AMBULATORY_CARE_PROVIDER_SITE_OTHER): Payer: BC Managed Care – PPO | Admitting: Family Medicine

## 2014-01-23 VITALS — BP 128/70 | HR 70 | Temp 98.3°F | Wt 184.4 lb

## 2014-01-23 DIAGNOSIS — R350 Frequency of micturition: Secondary | ICD-10-CM

## 2014-01-23 DIAGNOSIS — R197 Diarrhea, unspecified: Secondary | ICD-10-CM

## 2014-01-23 DIAGNOSIS — D649 Anemia, unspecified: Secondary | ICD-10-CM

## 2014-01-23 LAB — POCT URINALYSIS DIPSTICK
Bilirubin, UA: NEGATIVE
Glucose, UA: NEGATIVE
KETONES UA: NEGATIVE
LEUKOCYTES UA: NEGATIVE
Nitrite, UA: NEGATIVE
PH UA: 6
PROTEIN UA: NEGATIVE
UROBILINOGEN UA: 0.2

## 2014-01-23 NOTE — Patient Instructions (Signed)
Gluten-Free Diet  Gluten is a protein found in many grains. Gluten is present in wheat, rye, and barley. Gluten from wheat, rye, and barley protein interferes with the absorption of food in people with gluten sensitivity. It may also cause intestinal injury when eaten by individuals with gluten sensitivity.   A sample piece (biopsy) of the small intestine is usually required for a positive diagnosis of gluten sensitivity. Dietary treatment consists of eliminating foods and food ingredients from wheat, rye, and barley. When these are taken out of the diet completely, most people regain function of the small intestine.  Strict compliance is important even during symptom-free periods. People with gluten sensitivity need to be on a gluten-free diet for a lifetime. During the first stages of treatment, some people will also need to restrict dairy products that contain lactose, which is a naturally occurring sugar. Lactose is difficult to absorb when the small intestines are damaged (lactose intolerance).   WHO NEEDS THIS DIET  Some people who have certain diseases need to be on a gluten-free diet. These diseases include:  · Celiac disease.  · Nontropical sprue.  · Gluten-sensitive enteropathy.  · Dermatitis herpetiformis.  SPECIAL NOTES  · Read all labels because gluten may have been added as an incidental ingredient. Words to check for on the label include: flour, starch, durum flour, graham flour, phosphated flour, self-rising flour, semolina, farina, modified food starch, cereal, thickening, fillers, emulsifiers, any kind of malt flavoring, and hydrolyzed vegetable protein. A registered dietician can help you identify possible harmful ingredients in the foods you normally eat.  · If you are not sure whether an ingredient contains gluten, check with the manufacturer. Note that some manufacturers may change ingredients without notice. Always read labels.   · Since flour and cereal products are often used in the  preparation of foods, it is important to be aware of the methods of preparation used, as well as the foods themselves. This is especially true when you are dining out.  Starches  · Allowed: Only those prepared from arrowroot, corn, potato, rice, and bean flours. Rice wafers(*), pure cornmeal tortillas, popcorn, some crackers, and chips(*). Hot cereals made from cornmeal. Ask your dietician which specific hot and cold cereals are allowed. White or sweet potatoes, yams, hominy, rice or wild rice, and special gluten-free pasta. Some oriental rice noodles or bean noodles.  · Avoid: All wheat and rye cereals, wheat germ, barley, bran, graham, malt, bulgur, and millet(-). NOTE: Avoid cereals containing malt as a flavoring, such as rice cereal. Regular noodles, spaghetti, macaroni, and most packaged rice mixes(*). All others containing wheat, rye, or barley.  Vegetables  · Allowed: All plain, fresh, frozen, or canned vegetables.  · Avoid: Creamed vegetables(*) and vegetables canned in sauces(*). Any prepared with wheat, rye, or barley.  Fruit  · Allowed: All fresh, frozen, canned, or dried fruits. Fruit juices.  · Avoid: Thickened or prepared fruits and some pie fillings(*).  Meat and Meat Substitutes  · Allowed: Meat, fish, poultry, or eggs prepared without added wheat, rye, or barley. Luncheon meat(*), frankfurters(*), and pure meat. All aged cheese and processed cheese products(*). Cottage cheese(+) and cream cheese(+). Dried beans, dried peas, and lentils.  · Avoid: Any meat or meat alternate containing wheat, rye, barley, or gluten stabilizers. Bread-containing products, such as Swiss steak, croquettes, and meatloaf. Tuna canned in vegetable broth(*); turkey with HVP injected as part of the basting; any cheese product containing oat gum as an ingredient.  Milk  · Allowed: Milk.   Yogurt made with allowed ingredients(*).  · Avoid: Commercial chocolate milk which may have cereal added(*). Malted milk.  Soups and  Combination Foods  · Allowed: Homemade broth and soups made with allowed ingredients; some canned or frozen soups are allowed(*). Combination or prepared foods that do not contain gluten(*). Read labels.  · Avoid: All soups containing wheat, rye, or barley flour. Bouillon and bouillon cubes that contain hydrolyzed vegetable protein (HVP). Combination or prepared foods that contain gluten(*).  Desserts  · Allowed:  Custard, some pudding mixes(*), homemade puddings from cornstarch, rice, and tapioca. Gelatin desserts, ices, and sherbet(*). Cake, cookies, and other desserts prepared with allowed flours. Some commercial ice creams(*). Ask your dietician about specific brands of dessert that are allowed.  · Avoid: Cakes, cookies, doughnuts, and pastries that are prepared with wheat, rye, or barley flour. Some commercial ice creams(*), ice cream flavors which contain cookies, crumbs, or cheesecake(*). Ice cream cones. All commercially prepared mixes for cakes, cookies, and other desserts(*). Bread pudding and other puddings thickened with flour.  Sweets  · Allowed: Sugar, honey, syrup(*), molasses, jelly, jam, plain hard candy, marshmallows, gumdrops, homemade candies free from wheat, rye, or barley. Coconut.  · Avoid: Commercial candies containing wheat, rye, or barley(*). Certain buttercrunch toffees are dusted with wheat flour. Ask your dietician about specific brands that are not allowed. Chocolate-coated nuts, which are often rolled in flour.  Fats and Oils  · Allowed: Butter, margarine, vegetable oil, sour cream(+), whipping cream, shortening, lard, cream, mayonnaise(*). Some commercial salad dressings(*). Peanut butter.  · Avoid: Some commercial salad dressings(*).  Beverages  · Allowed: Coffee (regular or decaffeinated), tea, herbal tea (read label to be sure that no wheat flour has been added). Carbonated beverages and some root beers(*). Wine, sake, and distilled spirits, such as gin, vodka, and  whiskey.  · Avoid:  Certain cereal beverages. Ask your dietician about specific brands that are not allowed. Beer (unless gluten-free), ale, malted milk, and some root beers, wine, and sake.  Condiments/ Miscellaneous  · Allowed: Salt, pepper, herbs, spices, extracts, and food colorings. Monosodium glutamate (MSG). Cider, rice, and wine vinegar. Baking soda and baking powder. Certain soy sauces. Ask your dietician about specific brands that are allowed. Nuts, coconut, chocolate, and pure cocoa powder.  · Avoid: Some curry powder(*), some dry seasoning mixes(*), some gravy extracts(*), some meat sauces(*), some catsup(*), some prepared mustard(*), horseradish(*), some soy sauce(*), chip dips(*), and some chewing gum(*). Yeast extract (contains barley). Caramel color (may contain malt). Ask your dietician about specific brands of condiments to avoid.  Flour and Thickening Agents  · Allowed: Arrowroot starch (A); Corn bran (B); Corn flour (B,C,D); Corn germ (B); Cornmeal (B,C,D); Corn starch (A); Potato flour (B,C,E); Potato starch flour (B,C,E); Rice bran (B); Rice flours: Plain, brown (B,C,D,E), and Sweet (A,B,C,F). Rice polish (B,C,G); Soy flour (B,C,G); Tapioca starch (A).  The flour and thickening agents described above are good for:  (A) Good thickening agent  (B) Good when combined with other flours  (C) Best combined with milk and eggs in baked products  (D) Best in grainy-textured products  (E) Produces drier product than other flours  (F) Produces moister product than other flours  (G) Adds distinct flavor to product. Use in moderation.  (*) Check labels and investigate any questionable ingredients.   (-) Additional research is needed before this product can be recommended.  (+) Check vegetable gum used.  SAMPLE MEAL PLAN  Breakfast   · Orange juice.  · Banana.  ·   Rice or corn cereal.  · Toast (gluten-free bread).  · Heart-healthy tub margarine.  · Jam.  · Milk.  · Coffee or tea.  Lunch  · Chicken salad  sandwich (with gluten-free bread and mayonnaise).  · Sliced tomatoes.  · Heart-healthy tub margarine.  · Apple.  · Milk.  · Coffee or tea.  Dinner  · Roast beef.  · Baked potato.  · Broccoli.  · Lettuce salad with gluten-free dressing.  · Gluten-free bread.  · Custard.  · Heart-healthy margarine.  · Coffee or tea.  These meal plans are provided as samples. Your daily meal plans will vary.  Document Released: 09/22/2005 Document Revised: 03/23/2012 Document Reviewed: 11/02/2011  Exit[employer]® Patient Information ©2014 [employer].

## 2014-01-23 NOTE — Progress Notes (Signed)
Pre visit review using our clinic review tool, if applicable. No additional management support is needed unless otherwise documented below in the visit note. 

## 2014-01-23 NOTE — Progress Notes (Signed)
   Subjective:    Patient ID: Morgan [name], female    DOB: 1950/11/12, 63 y.o.   MRN: [id number]  HPI Pt here c/o diarrhea with wheat/ gluten and wants to be checked for celiac.  She also took a Set designerquestionare online and wants vita levels checked as well .  Pt also c/o urinary frequency.     Review of Systems As above    Objective:   Physical Exam  BP 128/70  Pulse 70  Temp(Src) 98.3 F (36.8 C) (Oral)  Wt 184 lb 6.4 oz (83.643 kg)  SpO2 98% General appearance: alert, cooperative, appears stated age and no distress Throat: lips, mucosa, and tongue normal; teeth and gums normal Neck: no adenopathy, no carotid bruit, no JVD, supple, symmetrical, trachea midline and thyroid not enlarged, symmetric, no tenderness/mass/nodules Lungs: clear to auscultation bilaterally Heart: S1, S2 normal Abdomen: soft, non-tender; bowel sounds normal; no masses,  no organomegaly      Assessment & Plan:  1. Diarrhea Pt thinks she may have gluten allergy Info on diet given to pt - Gliadin antibodies, serum - Tissue transglutaminase, IgA - Reticulin Antibody, IgA w reflex titer - Vitamin D 1,25 dihydroxy  2. Anemia Check labs - CBC with Differential - B12 - IBC panel - Ferritin; Future  3. Urinary frequency Check urine - POCT urinalysis dipstick

## 2014-01-23 NOTE — Telephone Encounter (Signed)
Rx. Filled by Almeta MonasKimberly Payne, CMA

## 2014-01-23 NOTE — Addendum Note (Signed)
Addended by: Silvio PateHOMPSON, Aizah Gehlhausen D on: 01/23/2014 04:50 PM   Modules accepted: Orders

## 2014-01-23 NOTE — Telephone Encounter (Signed)
Last seen 10/20/13 and filled 12/26/13 #120. No UDS on file   Please advise     KP

## 2014-01-24 LAB — CBC WITH DIFFERENTIAL/PLATELET
Basophils Absolute: 0 10*3/uL (ref 0.0–0.1)
Basophils Relative: 1 % (ref 0.0–3.0)
Eosinophils Absolute: 0.1 10*3/uL (ref 0.0–0.7)
Eosinophils Relative: 2.5 % (ref 0.0–5.0)
HCT: 34.5 % — ABNORMAL LOW (ref 36.0–46.0)
Hemoglobin: 11.8 g/dL — ABNORMAL LOW (ref 12.0–15.0)
Lymphocytes Relative: 57.6 % — ABNORMAL HIGH (ref 12.0–46.0)
Lymphs Abs: 2.6 10*3/uL (ref 0.7–4.0)
MCHC: 34.1 g/dL (ref 30.0–36.0)
MCV: 80.1 fl (ref 78.0–100.0)
MONO ABS: 0.4 10*3/uL (ref 0.1–1.0)
Monocytes Relative: 8.6 % (ref 3.0–12.0)
NEUTROS PCT: 30.3 % — AB (ref 43.0–77.0)
Neutro Abs: 1.4 10*3/uL (ref 1.4–7.7)
PLATELETS: 236 10*3/uL (ref 150.0–400.0)
RBC: 4.31 Mil/uL (ref 3.87–5.11)
RDW: 13 % (ref 11.5–14.6)
WBC: 4.5 10*3/uL (ref 4.5–10.5)

## 2014-01-24 LAB — GLIADIN ANTIBODIES, SERUM
GLIADIN IGA: 4.1 U/mL (ref <20)
Gliadin IgG: 75 U/mL — ABNORMAL HIGH (ref <20)

## 2014-01-24 LAB — RETICULIN ANTIBODIES, IGA W TITER: Reticulin Ab, IgA: NEGATIVE

## 2014-01-24 LAB — URINE CULTURE
Colony Count: NO GROWTH
Organism ID, Bacteria: NO GROWTH

## 2014-01-24 LAB — TISSUE TRANSGLUTAMINASE, IGA: Tissue Transglutaminase Ab, IgA: 4.6 U/mL (ref <20)

## 2014-01-24 LAB — IBC PANEL
IRON: 67 ug/dL (ref 42–145)
Saturation Ratios: 20.5 % (ref 20.0–50.0)
TRANSFERRIN: 233.3 mg/dL (ref 212.0–360.0)

## 2014-01-24 LAB — VITAMIN B12: VITAMIN B 12: 487 pg/mL (ref 211–911)

## 2014-01-27 LAB — VITAMIN D 1,25 DIHYDROXY
Vitamin D 1, 25 (OH)2 Total: 70 pg/mL (ref 18–72)
Vitamin D3 1, 25 (OH)2: 70 pg/mL

## 2014-01-30 ENCOUNTER — Ambulatory Visit: Payer: BC Managed Care – PPO | Admitting: Family Medicine

## 2014-02-21 ENCOUNTER — Other Ambulatory Visit: Payer: Self-pay | Admitting: Family Medicine

## 2014-02-21 NOTE — Telephone Encounter (Signed)
Last seen and filled 01/23/14 #120 Contract signed. No UDS on file   Please advise    KP

## 2014-03-20 NOTE — Telephone Encounter (Signed)
error 

## 2014-03-27 ENCOUNTER — Other Ambulatory Visit: Payer: Self-pay | Admitting: Family Medicine

## 2014-03-27 NOTE — Telephone Encounter (Signed)
Caller name:  Nicole CellaDorothy Relation to pt:  self Call back number:  [phone] Pharmacy:  [facility] [address]  Reason for call: Pt is leaving tomorrow for [place] and needs to pick up traMADol (ULTRAM) 50 MG tablet before 12 tomorrow (03/28/14).

## 2014-03-27 NOTE — Telephone Encounter (Signed)
Last seen 01/23/14 and filled 02/21/14 #120. Please advise     KP

## 2014-04-27 ENCOUNTER — Other Ambulatory Visit: Payer: Self-pay | Admitting: Family Medicine

## 2014-04-27 NOTE — Telephone Encounter (Signed)
Med filled and placed up front for pick up.

## 2014-04-27 NOTE — Telephone Encounter (Signed)
Last seen 01/23/14 and filled 03/27/14 #120. Contract signed No UDS    Please advise    KP

## 2014-04-27 NOTE — Telephone Encounter (Signed)
Ok for #120 but needs UDS 

## 2014-04-27 NOTE — Telephone Encounter (Signed)
Called patient and made her aware.  Stated that she would come and pick it up before 5 pm today.

## 2014-05-09 ENCOUNTER — Encounter: Payer: Self-pay | Admitting: Family Medicine

## 2014-05-26 ENCOUNTER — Other Ambulatory Visit: Payer: Self-pay | Admitting: Family Medicine

## 2014-05-29 NOTE — Telephone Encounter (Signed)
Last seen 01/23/14 and filled 04/27/14 #120 No UDS  Please advise    KP

## 2014-06-15 ENCOUNTER — Encounter: Payer: Self-pay | Admitting: Family Medicine

## 2014-06-15 ENCOUNTER — Ambulatory Visit: Payer: BC Managed Care – PPO | Admitting: Family Medicine

## 2014-06-15 ENCOUNTER — Ambulatory Visit (INDEPENDENT_AMBULATORY_CARE_PROVIDER_SITE_OTHER): Payer: BC Managed Care – PPO | Admitting: Family Medicine

## 2014-06-15 ENCOUNTER — Ambulatory Visit (HOSPITAL_BASED_OUTPATIENT_CLINIC_OR_DEPARTMENT_OTHER)
Admission: RE | Admit: 2014-06-15 | Discharge: 2014-06-15 | Disposition: A | Discharge status: Home / Self Care | Payer: BC Managed Care – PPO | Source: Ambulatory Visit | Attending: Family Medicine | Admitting: Family Medicine

## 2014-06-15 VITALS — BP 155/83 | HR 65 | Temp 98.8°F | Wt 178.1 lb

## 2014-06-15 DIAGNOSIS — M79609 Pain in unspecified limb: Secondary | ICD-10-CM | POA: Insufficient documentation

## 2014-06-15 DIAGNOSIS — M79675 Pain in left toe(s): Secondary | ICD-10-CM

## 2014-06-15 DIAGNOSIS — S92919A Unspecified fracture of unspecified toe(s), initial encounter for closed fracture: Secondary | ICD-10-CM | POA: Diagnosis not present

## 2014-06-15 DIAGNOSIS — X58XXXA Exposure to other specified factors, initial encounter: Secondary | ICD-10-CM | POA: Insufficient documentation

## 2014-06-15 MED ORDER — HYDROCODONE-ACETAMINOPHEN 5-325 MG PO TABS: 1.0000 | ORAL_TABLET | Freq: Four times a day (QID) | ORAL | Status: DC | PRN

## 2014-06-15 NOTE — Patient Instructions (Signed)

## 2014-06-15 NOTE — Progress Notes (Signed)
Pre visit review using our clinic review tool, if applicable. No additional management support is needed unless otherwise documented below in the visit note. 

## 2014-06-15 NOTE — Progress Notes (Signed)
   Subjective:    Patient ID: Morgan [name], female    DOB: [birth year], 63 y.o.   MRN: [id number]  HPI Pt here c/o pain in L big toe after dropping wooden box on toe yesterday.  Pt states toe was throbbing all night and ultram did not touch it. It was more swollen this am when she woke up.      Review of Systems As above    Objective:   Physical Exam  BP 155/83  Pulse 65  Temp(Src) 98.8 F (37.1 C) (Oral)  Wt 178 lb 2.1 oz (80.8 kg)  SpO2 99% General appearance: alert, cooperative, appears stated age and no distress Extremities: L foot toe-- + hematoma drained to relieve pressure--using sterile needle after cleaning with betadine, -- toe swollen with pain on palp       Assessment & Plan:  1. Toe pain, left Xray,  Hematoma drained with sterile needle after betadine Buddy taped toe Post op shoe  Pain med per orders - DG Foot Complete Left; Future

## 2014-06-16 ENCOUNTER — Telehealth: Payer: Self-pay | Admitting: Family Medicine

## 2014-06-16 DIAGNOSIS — S92912A Unspecified fracture of left toe(s), initial encounter for closed fracture: Secondary | ICD-10-CM

## 2014-06-16 NOTE — Telephone Encounter (Signed)
MSG left to call the office      KP 

## 2014-06-16 NOTE — Telephone Encounter (Signed)
Caller name: Takyra  Relation to pt: self  Call back number: [phone]   Reason for call:   pt requesting x ray results

## 2014-06-16 NOTE — Telephone Encounter (Signed)
Fracture toe-- refer to ortho

## 2014-06-16 NOTE — Telephone Encounter (Signed)
Pt aware.

## 2014-06-29 ENCOUNTER — Telehealth: Payer: Self-pay | Admitting: Family Medicine

## 2014-06-29 MED ORDER — TRAMADOL HCL 50 MG PO TABS: ORAL_TABLET | ORAL | Status: DC

## 2014-06-29 NOTE — Telephone Encounter (Signed)
Last seen 06/15/14 and filled 05/29/14 #120. Please advise     KP

## 2014-06-29 NOTE — Telephone Encounter (Signed)
Rx faxed.    KP 

## 2014-06-29 NOTE — Telephone Encounter (Signed)
Pt is needing new rx traMADol (ULTRAM) 50 MG tablet Please send to [facility]- [address], [place]

## 2014-06-29 NOTE — Telephone Encounter (Signed)
refillx1

## 2014-08-01 ENCOUNTER — Telehealth: Payer: Self-pay | Admitting: Family Medicine

## 2014-08-01 DIAGNOSIS — I10 Essential (primary) hypertension: Secondary | ICD-10-CM

## 2014-08-01 MED ORDER — LISINOPRIL-HYDROCHLOROTHIAZIDE 20-12.5 MG PO TABS: ORAL_TABLET | ORAL | Status: DC

## 2014-08-01 NOTE — Telephone Encounter (Signed)
Caller name: Romero LinerSanders, Jaileen Relation to pt: self  Call back number: [phone] Pharmacy: NEW PHARMACY PT HAS MOVED PLEASE USE [address], [place], [place] [address] [phone]    Reason for call:   pt requesting refill traMADol (ULTRAM) 50 MG tablet and lisinopril-hydrochlorothiazide (PRINZIDE,ZESTORETIC) 20-12.5 MG per tablet

## 2014-08-01 NOTE — Telephone Encounter (Signed)
Last seen 9/10/115 and filled 06/29/14 #120 No UDS on file.   Please advise     KP

## 2014-08-02 ENCOUNTER — Other Ambulatory Visit: Payer: Self-pay | Admitting: Family Medicine

## 2014-08-02 MED ORDER — TRAMADOL HCL 50 MG PO TABS: ORAL_TABLET | ORAL | Status: DC

## 2014-08-02 NOTE — Telephone Encounter (Signed)
Refill ultram x1  2 refills Refill bp med for 6 months

## 2014-08-02 NOTE — Telephone Encounter (Signed)
Ultram prescription printed and placed in Dr. Ernst SpellLowne's red folder for review and signature.

## 2014-08-03 NOTE — Telephone Encounter (Signed)
[facility] Triage Call Report  Triage Record Num: [id number] Operator: Mcarthur RossettiBonnie Meadors Patient Name: Morgan [name] Call Date & Time: 08/02/2014 7:10:31PM Patient Phone: [phone] PCP: Lelon PerlaYvonne R. Lowne Patient Gender: Female PCP Fax : [phone] Patient DOB: [birth year] [facility] [place] [facility]  Reason for Call: Caller: Morgan [name]; PCP: Lelon PerlaLowne, Yvonne R.; CB#: [phone]; Call regarding Medication questions; [facility] on [address] 08/02/2014 Tamadol/ Ultram 50 mg tab. Take 1-2 tab po every 6 hrs as needed. #120 tabs with 2 refills/ Dr Loreen FreudYvonne Lowne.[facility] Erin[facility] [address] [place]. Triaged per Medication question guideline. Disposition Prescription called in per EMR.Pt notified.  Protocol(s) Used: Medication Questions - Adult Recommended Outcome per Protocol: Call Provider within 4 Hours Reason for Outcome: Prescription ordered today and not available at pharmacy putting patient at clinical risk  Care Advice: ~

## 2014-08-03 NOTE — Telephone Encounter (Signed)
Printed prescription shredded.

## 2014-08-04 ENCOUNTER — Ambulatory Visit (INDEPENDENT_AMBULATORY_CARE_PROVIDER_SITE_OTHER): Payer: BC Managed Care – PPO | Admitting: Medical

## 2014-08-04 ENCOUNTER — Encounter: Payer: Self-pay | Admitting: Medical

## 2014-08-04 VITALS — BP 130/70 | HR 79 | Temp 98.7°F | Ht 65.0 in | Wt 176.2 lb

## 2014-08-04 DIAGNOSIS — M545 Low back pain, unspecified: Secondary | ICD-10-CM

## 2014-08-04 DIAGNOSIS — M79675 Pain in left toe(s): Secondary | ICD-10-CM

## 2014-08-04 DIAGNOSIS — Z566 Other physical and mental strain related to work: Secondary | ICD-10-CM

## 2014-08-04 NOTE — Progress Notes (Signed)
Pre visit review using our clinic review tool, if applicable. No additional management support is needed unless otherwise documented below in the visit note. 

## 2014-08-04 NOTE — Assessment & Plan Note (Signed)
Pt will continue her tramadol. No change in management.

## 2014-08-04 NOTE — Patient Instructions (Signed)
For your lt great toe with thick disfigured nail that won't come off, I will refer you to the podiatrist for evalation for removal of the nail.  Continue tramadol for your back pain as you have been using. If your back pain worsens with radiating features or leg weakness let us know.  I will write a note for your school expressing in my opinion it is reasonable to allow you extra time for projects due to recent medical problems.   Follow as regularly scheduled with pcp or as needed.

## 2014-08-04 NOTE — Assessment & Plan Note (Addendum)
Minimal pain now. Has nail that is almost off but still intact. Mild uncomfortable feel. Will refer to podiatrist.

## 2014-08-04 NOTE — Assessment & Plan Note (Signed)
Some at work but mostly at school.(could not find school stress in particular.) Will monitor and see how she handles this. Did give a note for school to see if they will allow more time for some of her work assignments.

## 2014-08-04 NOTE — Progress Notes (Signed)
Subjective:    Patient ID: Morgan [name], female    DOB: 28-Jun-1951, 63 y.o.   MRN: [id number]  HPI  Pt in states one month ago she fractured her lt great toe. Pt seen by orthopedist. Pt was cleared by ortho but she wants referral to podiatrist. Pt has thick nail that looks like it would fall off but the nail has not come off. Fx was sept 11 th.  Pt states history of back pain. Pt has ongoing long hx of. Hx of back pain. History of ruptured disk. Pt taking tramadol. She usually takes only 2 tabs a day. Pain for more than 5 yrs. Pt just got rx of 120 tabs.  Also very stressed with school. Pt studying mental health counseling. Pt also works. This semester she got behind with work pt. Attributes getting behind due to medical problems.She wants a note requesting some more time to do projects.  Past Medical History  Diagnosis Date  . Hypertension   . Asthma   . Low back pain     History   Social History  . Marital Status: Married    Spouse Name: N/A    Number of Children: N/A  . Years of Education: N/A   Occupational History  . sub     Social History Main Topics  . Smoking status: Never Smoker   . Smokeless tobacco: Never Used  . Alcohol Use: No  . Drug Use: No  . Sexual Activity: Not on file   Other Topics Concern  . Not on file   Social History Narrative  . No narrative on file    Past Surgical History  Procedure Laterality Date  . Back surgery      Low Back    Family History  Problem Relation Age of Onset  . Cancer    . Throat cancer Sister   . Stroke    . Hypertension      Allergies  Allergen Reactions  . Alupent [Metaproterenol]   . Hydrocodone-Acetaminophen     Current Outpatient Prescriptions on File Prior to Visit  Medication Sig Dispense Refill  . HYDROcodone-acetaminophen (NORCO/VICODIN) 5-325 MG per tablet Take 1 tablet by mouth every 6 (six) hours as needed for moderate pain.  30 tablet  0  . lisinopril-hydrochlorothiazide  (PRINZIDE,ZESTORETIC) 20-12.5 MG per tablet TAKE ONE TABLET BY MOUTH EVERY DAY  90 tablet  1  . traMADol (ULTRAM) 50 MG tablet TAKE 1 OR 2 TABLETS BY MOUTH EVERY 6 HOURS AS NEEDED  120 tablet  2   No current facility-administered medications on file prior to visit.    BP 130/70  Pulse 79  Temp(Src) 98.7 F (37.1 C) (Oral)  Ht 5\' 5"  (1.651 m)  Wt 176 lb 3.2 oz (79.924 kg)  BMI 29.32 kg/m2  SpO2 98%     Review of Systems  Constitutional: Negative for fever, chills and fatigue.  HENT: Negative.   Respiratory: Negative for cough, shortness of breath and wheezing.   Cardiovascular: Negative for chest pain and palpitations.  Gastrointestinal: Negative.   Musculoskeletal: Positive for back pain.       See hpi for back pain.  See hpi for toe description.  Neurological: Negative.   Hematological: Negative for adenopathy. Does not bruise/bleed easily.  Psychiatric/Behavioral: Negative for hallucinations, behavioral problems, confusion, dysphoric mood, decreased concentration and agitation. The patient is nervous/anxious. The patient is not hyperactive.        Maybe som anxiety. Basically describes a lot of stress related to  medical problems and school work.       Objective:   Physical Exam  Constitutional: She is oriented to person, place, and time. She appears well-developed and well-nourished. No distress.  HENT:  Head: Normocephalic and atraumatic.  Right Ear: External ear normal.  Left Ear: External ear normal.  Eyes: Conjunctivae and EOM are normal. Pupils are equal, round, and reactive to light. Right eye exhibits no discharge. Left eye exhibits no discharge.  Neck: Normal range of motion. Neck supple. No JVD present. No tracheal deviation present. No thyromegaly present.  Cardiovascular: Normal rate, regular rhythm and normal heart sounds.  Exam reveals no gallop and no friction rub.   No murmur heard. Pulmonary/Chest: Effort normal and breath sounds normal. No stridor. No  respiratory distress. She has no wheezes. She has no rales. She exhibits no tenderness.  Abdominal: Soft. Bowel sounds are normal. She exhibits no distension and no mass. There is no tenderness. There is no rebound and no guarding.  Musculoskeletal: She exhibits no edema and no tenderness.  Back- mid spinal tenderness to palpation. Pain on straight leg lift minimal.  Lower extremity- l5-S1 sensation intact bilaterally.  Lt great toe- normal color. Faint swelling. Toenail is loose but intact and looks like new one coming in.  Lymphadenopathy:    She has no cervical adenopathy.  Neurological: She is alert and oriented to person, place, and time. No cranial nerve deficit. Coordination normal.  Skin: Skin is warm and dry. No rash noted. She is not diaphoretic. No erythema. No pallor.  Psychiatric: She has a normal mood and affect. Her behavior is normal. Judgment and thought content normal.           Assessment & Plan:

## 2014-08-10 ENCOUNTER — Encounter: Payer: Self-pay | Admitting: Podiatry

## 2014-08-10 ENCOUNTER — Ambulatory Visit (INDEPENDENT_AMBULATORY_CARE_PROVIDER_SITE_OTHER): Payer: BC Managed Care – PPO | Admitting: Podiatry

## 2014-08-10 VITALS — BP 120/71 | HR 61 | Resp 16

## 2014-08-10 DIAGNOSIS — B351 Tinea unguium: Secondary | ICD-10-CM

## 2014-08-10 NOTE — Progress Notes (Signed)
   Subjective:    Patient ID: Morgan [name], female    DOB: Jul 18, 1951, 63 y.o.   MRN: [id number]  HPI Comments: "I injured by nail"  Patient c/o 1st toenail left since September 4th. She injured the toe and it was fractured in 2 places. This was treated by [facility] and this has healed. She has no pain in the toe. The nail became loose after the injury and last night it fell off. She has just been keeping band aid over the nail area.     Review of Systems  All other systems reviewed and are negative.      Objective:   Physical Exam        Assessment & Plan:

## 2014-08-10 NOTE — Progress Notes (Signed)
Subjective:     Patient ID: Morgan [name], female   DOB: 1951-01-15, 63 y.o.   MRN: [id number]  HPIpatient presents stating Morgan [name] bumped her left big toe broke her big toe bone and her nail became damaged and just fell off yesterday. Not currently painful   Review of Systems  All other systems reviewed and are negative.      Objective:   Physical Exam  Constitutional: Morgan [name] is oriented to person, place, and time.  Cardiovascular: Intact distal pulses.   Musculoskeletal: Normal range of motion.  Neurological: Morgan [name] is oriented to person, place, and time.  Skin: Skin is warm.  Nursing note and vitals reviewed. neurovascular status intact with muscle strength adequate and range of motion of the subtalar and midtarsal joint within normal limits. Patient is noted to have good digital perfusion and is well oriented 3 with mild equinus condition noted. I noted the left hallux nail is moderately damaged as far as the bed and goes but the toe itself has minimal swelling     Assessment:     Improving left foot with probable repair of fracture and nail which we'll need to regrow    Plan:     H&P performed condition discussed. I've recommended continued soaks and if it were to come ingrown or thick we may need to consider removal at one time in the future reappoint as needed

## 2014-11-13 ENCOUNTER — Other Ambulatory Visit: Payer: Self-pay | Admitting: Family Medicine

## 2014-11-13 NOTE — Telephone Encounter (Signed)
Last seen 06/15/14 and filled 08/02/14 #60 with 2 refills.  UDS 04/28/14 low risk   Please advise    KP

## 2014-11-20 LAB — HM PAP SMEAR

## 2014-11-21 ENCOUNTER — Other Ambulatory Visit: Payer: Self-pay | Admitting: Obstetrics and Gynecology

## 2014-11-21 DIAGNOSIS — Z1231 Encounter for screening mammogram for malignant neoplasm of breast: Secondary | ICD-10-CM

## 2014-11-29 ENCOUNTER — Other Ambulatory Visit: Payer: Self-pay | Admitting: Obstetrics and Gynecology

## 2014-11-29 ENCOUNTER — Ambulatory Visit
Admission: RE | Admit: 2014-11-29 | Discharge: 2014-11-29 | Disposition: A | Discharge status: Home / Self Care | Payer: 59 | Source: Ambulatory Visit | Attending: Obstetrics and Gynecology | Admitting: Obstetrics and Gynecology

## 2014-11-29 DIAGNOSIS — Z1231 Encounter for screening mammogram for malignant neoplasm of breast: Secondary | ICD-10-CM

## 2014-11-30 ENCOUNTER — Other Ambulatory Visit: Payer: Self-pay | Admitting: Obstetrics and Gynecology

## 2014-11-30 DIAGNOSIS — R928 Other abnormal and inconclusive findings on diagnostic imaging of breast: Secondary | ICD-10-CM

## 2014-12-14 ENCOUNTER — Telehealth: Payer: Self-pay | Admitting: Family Medicine

## 2014-12-14 NOTE — Telephone Encounter (Signed)
Last filled:  2.8.16 Amt: 120, 0 refills Last OV: 08/04/14 with Esperanza RichtersEdward Saguier No upcoming appts No contract UDS:  LOW Risk

## 2014-12-14 NOTE — Telephone Encounter (Signed)
Caller name: Romero LinerSanders, Dannah Relation to pt: self  Call back number: [phone]   Reason for call:  Pt is requesting a refill traMADol (ULTRAM) 50 MG tablet. Pt last pill is 12/15/14

## 2014-12-15 MED ORDER — TRAMADOL HCL 50 MG PO TABS: 50.0000 mg | ORAL_TABLET | Freq: Four times a day (QID) | ORAL | Status: DC | PRN

## 2014-12-15 NOTE — Telephone Encounter (Signed)
Faxed to [facility] in [place], [place] as requested/

## 2014-12-15 NOTE — Telephone Encounter (Signed)
Please advise 

## 2014-12-15 NOTE — Telephone Encounter (Signed)
Patient calling in regarding this.   [facility] [address] in [place]

## 2014-12-15 NOTE — Telephone Encounter (Signed)
Okay to send #30 , further refills per PCP

## 2014-12-15 NOTE — Telephone Encounter (Signed)
Rx printed, signed by Dr. Drue NovelPaz in Dr. Ernst SpellLowne's absence.

## 2014-12-18 ENCOUNTER — Other Ambulatory Visit: Payer: 59

## 2014-12-18 NOTE — Telephone Encounter (Signed)
[facility] [place] [place] to fax number [phone] which is the correct pharmacy given. Received fax confirmation 12/15/2014 at 17:11. However, I have [facility]. Fax confirmation received again at 12/18/2014 at 08:58.

## 2014-12-18 NOTE — Telephone Encounter (Signed)
Pt states pharmacy never received traMADol (ULTRAM) 50 MG tablet. Please send to [facility] [address], [place], [place] [id number]  [phone]. As per pt request. (As per chart RX was sent to wrong pharmacy)

## 2014-12-20 NOTE — Telephone Encounter (Signed)
Patient states that she did pickup rx

## 2014-12-20 NOTE — Telephone Encounter (Signed)
Followed up with pt to check if RX was picked up, there was a bad phone connection advised pt to call back.

## 2014-12-21 ENCOUNTER — Ambulatory Visit
Admission: RE | Admit: 2014-12-21 | Discharge: 2014-12-21 | Disposition: A | Discharge status: Home / Self Care | Payer: 59 | Source: Ambulatory Visit | Attending: Obstetrics and Gynecology | Admitting: Obstetrics and Gynecology

## 2014-12-21 DIAGNOSIS — R928 Other abnormal and inconclusive findings on diagnostic imaging of breast: Secondary | ICD-10-CM

## 2014-12-27 ENCOUNTER — Telehealth: Payer: Self-pay | Admitting: Family Medicine

## 2014-12-27 DIAGNOSIS — M79675 Pain in left toe(s): Secondary | ICD-10-CM

## 2014-12-27 NOTE — Telephone Encounter (Signed)
Caller name: Krislyn Relation to pt: self Call back number:[phone] Pharmacy:  Reason for call:   Patient states that she didn't receive the full prescription of tramadol last time it was filled and wants to know what she should do about this

## 2014-12-28 MED ORDER — HYDROCODONE-ACETAMINOPHEN 5-325 MG PO TABS: 1.0000 | ORAL_TABLET | Freq: Four times a day (QID) | ORAL | Status: DC | PRN

## 2014-12-28 NOTE — Telephone Encounter (Signed)
Patient aware the Rx is ready for pick up.     KP 

## 2014-12-28 NOTE — Telephone Encounter (Signed)
Last filled: 12/14/14 only given 30 and normally gets 120. Last OV: 08/04/14 with Esperanza RichtersEdward Saguier No upcoming appts No contract UDS: LOW Risk   Please advise      KP

## 2014-12-28 NOTE — Telephone Encounter (Signed)
Refill #120   Need contract

## 2015-01-01 ENCOUNTER — Telehealth: Payer: Self-pay | Admitting: Family Medicine

## 2015-01-01 MED ORDER — TRAMADOL HCL 50 MG PO TABS: 50.0000 mg | ORAL_TABLET | Freq: Four times a day (QID) | ORAL | Status: DC | PRN

## 2015-01-01 NOTE — Telephone Encounter (Signed)
Caller name: Romero LinerSanders, Tujuana Relation to pt: self  Call back number: [phone]   Reason for call:  Pt was prescribed HYDROcodone-acetaminophen (NORCO/VICODIN)  instead of traMADol (ULTRAM) and would like to know why medication was changed. Please advise

## 2015-01-01 NOTE — Telephone Encounter (Signed)
Second Rx can in from pharmacy in [place] for the Hydrocodone and she will be exchanged, left in the red folder to resigned.     KP

## 2015-01-25 ENCOUNTER — Telehealth: Payer: Self-pay | Admitting: Family Medicine

## 2015-01-25 NOTE — Telephone Encounter (Signed)
error 

## 2015-01-31 ENCOUNTER — Telehealth: Payer: Self-pay | Admitting: Family Medicine

## 2015-01-31 NOTE — Telephone Encounter (Addendum)
Caller name: Britta MccreedyBarbara from [facility] Call back number: [phone]    Reason for call:  As per [facility] recommending pt to have colonoscopy. [facility] faxed over referral attention Dr. Laury AxonLowne

## 2015-02-01 NOTE — Telephone Encounter (Signed)
Our records show she had one in 2012 and repeat in 5 years.  Is there a reason or just f/u?

## 2015-02-01 NOTE — Telephone Encounter (Signed)
To MD for review     KP 

## 2015-02-01 NOTE — Telephone Encounter (Signed)
I made Morgan [name] aware when the last colonoscopy was done and she verbalized understanding.      KP

## 2015-02-02 ENCOUNTER — Other Ambulatory Visit: Payer: Self-pay | Admitting: Family Medicine

## 2015-02-02 NOTE — Telephone Encounter (Signed)
Last seen 06/15/14 and filled 01/01/15 #120 UDS 04/28/14 low risk   Please advise      KP

## 2015-02-05 ENCOUNTER — Other Ambulatory Visit: Payer: Self-pay | Admitting: Family Medicine

## 2015-02-06 ENCOUNTER — Telehealth: Payer: Self-pay | Admitting: Family Medicine

## 2015-02-06 DIAGNOSIS — Z1211 Encounter for screening for malignant neoplasm of colon: Secondary | ICD-10-CM

## 2015-02-06 NOTE — Telephone Encounter (Signed)
Please advise on the referral.      KP

## 2015-02-06 NOTE — Telephone Encounter (Signed)
Caller name: Minnette Relation to pt: self Call back number: [phone] Pharmacy:  Patient states that pharmacy did not receive tramadol rx. Please resend.   Patient states that it is time for her to have a colonoscopy. Is requesting referral. Cannot remember who she went to last time for colonoscopy.

## 2015-02-06 NOTE — Telephone Encounter (Signed)
Ref has been placed.     KP 

## 2015-02-06 NOTE — Telephone Encounter (Signed)
Ok to put referral in 

## 2015-03-06 ENCOUNTER — Other Ambulatory Visit: Payer: Self-pay

## 2015-03-06 DIAGNOSIS — I10 Essential (primary) hypertension: Secondary | ICD-10-CM

## 2015-03-06 MED ORDER — LISINOPRIL-HYDROCHLOROTHIAZIDE 20-12.5 MG PO TABS: ORAL_TABLET | ORAL | Status: DC

## 2015-03-08 ENCOUNTER — Other Ambulatory Visit: Payer: Self-pay | Admitting: Family Medicine

## 2015-03-08 NOTE — Telephone Encounter (Signed)
Pt scheduled for 03/12/15 at 8:15am.

## 2015-03-08 NOTE — Telephone Encounter (Signed)
Patient needs an apt for medication management.      KP

## 2015-03-12 ENCOUNTER — Ambulatory Visit (INDEPENDENT_AMBULATORY_CARE_PROVIDER_SITE_OTHER): Payer: 59 | Admitting: Family Medicine

## 2015-03-12 ENCOUNTER — Encounter: Payer: Self-pay | Admitting: Family Medicine

## 2015-03-12 VITALS — BP 120/76 | HR 65 | Temp 98.9°F | Wt 180.8 lb

## 2015-03-12 DIAGNOSIS — G8929 Other chronic pain: Secondary | ICD-10-CM

## 2015-03-12 DIAGNOSIS — K9041 Non-celiac gluten sensitivity: Secondary | ICD-10-CM

## 2015-03-12 DIAGNOSIS — I10 Essential (primary) hypertension: Secondary | ICD-10-CM | POA: Diagnosis not present

## 2015-03-12 DIAGNOSIS — K900 Celiac disease: Secondary | ICD-10-CM | POA: Insufficient documentation

## 2015-03-12 MED ORDER — TRAMADOL HCL 50 MG PO TABS: 50.0000 mg | ORAL_TABLET | Freq: Four times a day (QID) | ORAL | Status: DC | PRN

## 2015-03-12 NOTE — Assessment & Plan Note (Signed)
con't lisinopril Stable rto cpe

## 2015-03-12 NOTE — Progress Notes (Signed)
Patient ID: Morgan [name], female    DOB: Sep 10, 1951  Age: 64 y.o. MRN: [id number]    Subjective:  Subjective HPI Morgan [name] presents for f/u bp.    Review of Systems  Constitutional: Negative for diaphoresis, appetite change, fatigue and unexpected weight change.  Eyes: Negative for pain, redness and visual disturbance.  Respiratory: Negative for cough, chest tightness, shortness of breath and wheezing.   Cardiovascular: Negative for chest pain, palpitations and leg swelling.  Endocrine: Negative for cold intolerance, heat intolerance, polydipsia, polyphagia and polyuria.  Genitourinary: Negative for dysuria, frequency and difficulty urinating.  Neurological: Negative for dizziness, light-headedness, numbness and headaches.    History Past Medical History  Diagnosis Date  . Hypertension   . Asthma   . Low back pain     She has past surgical history that includes Back surgery.   Her family history includes Cancer in an other family member; Hypertension in an other family member; Stroke in an other family member; Throat cancer in her sister.She reports that she has never smoked. She has never used smokeless tobacco. She reports that she does not drink alcohol or use illicit drugs.  Current Outpatient Prescriptions on File Prior to Visit  Medication Sig Dispense Refill  . lisinopril-hydrochlorothiazide (PRINZIDE,ZESTORETIC) 20-12.5 MG per tablet TAKE 1 TABLET BY MOUTH EVERY DAY 90 tablet 0   No current facility-administered medications on file prior to visit.     Objective:  Objective Physical Exam  Constitutional: She is oriented to person, place, and time. She appears well-developed and well-nourished.  HENT:  Head: Normocephalic and atraumatic.  Eyes: Conjunctivae and EOM are normal.  Neck: Normal range of motion. Neck supple. No JVD present. Carotid bruit is not present. No thyromegaly present.  Cardiovascular: Normal rate, regular rhythm and normal heart sounds.    No murmur heard. Pulmonary/Chest: Effort normal and breath sounds normal. No respiratory distress. She has no wheezes. She has no rales. She exhibits no tenderness.  Musculoskeletal: She exhibits no edema.  Neurological: She is alert and oriented to person, place, and time.  Psychiatric: She has a normal mood and affect.   BP 120/76 mmHg  Pulse 65  Temp(Src) 98.9 F (37.2 C) (Oral)  Wt 180 lb 12.8 oz (82.01 kg)  SpO2 98% Wt Readings from Last 3 Encounters:  03/12/15 180 lb 12.8 oz (82.01 kg)  08/04/14 176 lb 3.2 oz (79.924 kg)  06/15/14 178 lb 2.1 oz (80.8 kg)     Lab Results  Component Value Date   WBC 4.5 01/23/2014   HGB 11.8* 01/23/2014   HCT 34.5* 01/23/2014   PLT 236.0 01/23/2014   GLUCOSE 83 06/04/2012   CHOL 145 06/04/2012   TRIG 73.0 06/04/2012   HDL 56.00 06/04/2012   LDLCALC 74 06/04/2012   ALT 23 06/04/2012   AST 20 06/04/2012   NA 142 06/04/2012   K 3.8 06/04/2012   CL 108 06/04/2012   CREATININE 0.7 06/04/2012   BUN 21 06/04/2012   CO2 28 06/04/2012   TSH 5.07 06/04/2012    Mm Diag Breast Tomo Uni Right  12/21/2014   CLINICAL DATA:  Call back from screening for a possible right breast mass.  EXAM: DIGITAL DIAGNOSTIC RIGHT MAMMOGRAM WITH 3D TOMOSYNTHESIS AND CAD  COMPARISON:  Prior exams  ACR Breast Density Category b: There are scattered areas of fibroglandular density.  FINDINGS: The possible mass seen on screening study is not evident on the follow-up 2D or 3D images. There is no evidence of a mass  and no architectural distortion. There are no suspicious calcifications.  Mammographic images were processed with CAD.  IMPRESSION: Normal exam.  No evidence of malignancy.  RECOMMENDATION: Screening mammogram in one year.(Code:SM-B-01Y)  I have discussed the findings and recommendations with the patient. Results were also provided in writing at the conclusion of the visit. If applicable, a reminder letter will be sent to the patient regarding the next  appointment.  BI-RADS CATEGORY  1: Negative.   Electronically Signed   By: Amie Portland M.D.   On: 12/21/2014 10:21     Assessment & Plan:  Plan I have changed Morgan [name]'s traMADol. I am also having her maintain her lisinopril-hydrochlorothiazide.  Meds ordered this encounter  Medications  . traMADol (ULTRAM) 50 MG tablet    Sig: Take 1-2 tablets (50-100 mg total) by mouth every 6 (six) hours as needed.    Dispense:  120 tablet    Refill:  1    Problem List Items Addressed This Visit    Gluten-sensitive enteropathy    con't to follow gluten free diet Pt feeing better on it      Essential hypertension    con't lisinopril Stable rto cpe       Other Visit Diagnoses    Chronic pain    -  Primary    Relevant Medications    traMADol (ULTRAM) 50 MG tablet       Follow-up: Return in about 6 months (around 09/11/2015), or if symptoms worsen or fail to improve, for annual exam, fasting.  Loreen Freud, DO

## 2015-03-12 NOTE — Patient Instructions (Signed)

## 2015-03-12 NOTE — Progress Notes (Signed)
Pre visit review using our clinic review tool, if applicable. No additional management support is needed unless otherwise documented below in the visit note. 

## 2015-03-12 NOTE — Assessment & Plan Note (Signed)
con't to follow gluten free diet Pt feeing better on it

## 2015-06-07 ENCOUNTER — Other Ambulatory Visit: Payer: Self-pay | Admitting: Family Medicine

## 2015-06-07 NOTE — Telephone Encounter (Signed)
Last seen and filled 03/12/15 #12p0 with 1 refill UDS 04/28/14 low risk   Please advise    KP

## 2015-06-08 MED ORDER — TRAMADOL HCL 50 MG PO TABS: 50.0000 mg | ORAL_TABLET | Freq: Four times a day (QID) | ORAL | Status: DC | PRN

## 2015-06-08 NOTE — Addendum Note (Signed)
Addended by: Arnette Norris on: 06/08/2015 08:13 AM   Modules accepted: Orders

## 2015-06-08 NOTE — Telephone Encounter (Signed)
Rx faxed.    KP 

## 2015-07-20 ENCOUNTER — Telehealth: Payer: Self-pay | Admitting: Family Medicine

## 2015-07-20 MED ORDER — TRAMADOL HCL 50 MG PO TABS: 50.0000 mg | ORAL_TABLET | Freq: Four times a day (QID) | ORAL | Status: DC | PRN

## 2015-07-20 NOTE — Telephone Encounter (Signed)
Correction [facility]

## 2015-07-20 NOTE — Telephone Encounter (Signed)
Last seen 03/12/15 and filled 06/08/15 #120 UDS 04/28/14 low risk  Please advise      KP

## 2015-07-20 NOTE — Telephone Encounter (Signed)
Received call from team health re pt out of town requesting tramadol. Demanded to speak to on call MD after team health told patient she could not have controlled substance filled over weekend. I spoke with patient. She states she called Monday to notify [facility] she would be out of town and [facility] [place]. No record of this call. Then she states she called today to remind [facility] to send Rx and was told this would be done. I see tramadol [facility] at [facility]. [facility] [place] who states they never received Rx. As I see this was approved by PCP today, I have approved this month's Rx with future refills to come from PCP. Pt aware.

## 2015-07-20 NOTE — Telephone Encounter (Signed)
Sorry Javier-----thanks for taking care of it.

## 2015-07-20 NOTE — Telephone Encounter (Signed)
Refill x1 

## 2015-07-20 NOTE — Telephone Encounter (Signed)
Rx faxed.    KP 

## 2015-07-20 NOTE — Telephone Encounter (Addendum)
Relation to pt: self  Call back number: [phone]  Pharm[facility]: [facility] [address] [place], [place] [id number] Phone: [phone]     Reason for call:  Patient requesting a refill traMADol (ULTRAM) 50 MG tableT. Patient is out of town

## 2015-07-23 NOTE — Telephone Encounter (Signed)
Dr Reece AgarG sent it again--- he called the pharmacy and they said they never received it ---he saw that you had sent it--- so frustrating but she can not act like that with anyone on the phone.

## 2015-07-23 NOTE — Telephone Encounter (Signed)
She said before he call it in, it was there, so she knows it was a pharmacy error and not ours.    KP

## 2015-07-23 NOTE — Telephone Encounter (Signed)
It was faxed on 07/20/15 and confirmation has been received, I called the patient and she did get the Rx.      KP

## 2015-08-20 ENCOUNTER — Other Ambulatory Visit: Payer: Self-pay | Admitting: Family Medicine

## 2015-08-20 NOTE — Telephone Encounter (Deleted)
Ok to refill 

## 2015-08-22 ENCOUNTER — Other Ambulatory Visit: Payer: Self-pay | Admitting: Family Medicine

## 2015-08-23 ENCOUNTER — Telehealth: Payer: Self-pay | Admitting: Family Medicine

## 2015-08-23 NOTE — Telephone Encounter (Signed)
Last seen 03/12/15 and filled 07/20/15 #120  Please advise     KP

## 2015-08-23 NOTE — Telephone Encounter (Signed)
error:[id number] ° °

## 2015-09-24 ENCOUNTER — Other Ambulatory Visit: Payer: Self-pay | Admitting: Family Medicine

## 2015-09-24 NOTE — Telephone Encounter (Signed)
Last seen 03/11/09 and filled 08/23/15 #120 UDS 04/28/14 low risk   Please advise     KP

## 2015-10-26 ENCOUNTER — Other Ambulatory Visit: Payer: Self-pay | Admitting: Family Medicine

## 2015-10-26 NOTE — Telephone Encounter (Signed)
Last seen 03/12/15 and filled 09/24/15 #120  UDS 04/28/14 low risk   please advise    KP

## 2015-11-28 ENCOUNTER — Other Ambulatory Visit: Payer: Self-pay | Admitting: Family Medicine

## 2015-11-29 NOTE — Telephone Encounter (Signed)
Last seen 03/12/15 and filled 10/26/15 #120 UDS 04/28/14 low risk   Please advise     KP

## 2015-12-07 ENCOUNTER — Other Ambulatory Visit: Payer: Self-pay | Admitting: Family Medicine

## 2015-12-25 DIAGNOSIS — Z01419 Encounter for gynecological examination (general) (routine) without abnormal findings: Secondary | ICD-10-CM | POA: Diagnosis not present

## 2016-01-14 ENCOUNTER — Other Ambulatory Visit: Payer: Self-pay | Admitting: Family Medicine

## 2016-01-14 NOTE — Telephone Encounter (Signed)
Last seen 03/12/15 and filled 11/29/15 #120  UDS 04/28/14 low risk   Please advise    KP

## 2016-02-26 ENCOUNTER — Other Ambulatory Visit: Payer: Self-pay | Admitting: Family Medicine

## 2016-02-29 ENCOUNTER — Encounter: Payer: Self-pay | Admitting: Family Medicine

## 2016-02-29 ENCOUNTER — Ambulatory Visit (INDEPENDENT_AMBULATORY_CARE_PROVIDER_SITE_OTHER): Payer: Medicare Other | Admitting: Family Medicine

## 2016-02-29 VITALS — BP 130/66 | HR 65 | Temp 98.6°F | Ht 65.0 in | Wt 178.2 lb

## 2016-02-29 DIAGNOSIS — I10 Essential (primary) hypertension: Secondary | ICD-10-CM | POA: Diagnosis not present

## 2016-02-29 DIAGNOSIS — M545 Low back pain: Secondary | ICD-10-CM

## 2016-02-29 LAB — CBC WITH DIFFERENTIAL/PLATELET
BASOS PCT: 0 %
Basophils Absolute: 0 cells/uL (ref 0–200)
EOS PCT: 2 %
Eosinophils Absolute: 110 cells/uL (ref 15–500)
HCT: 34.1 % — ABNORMAL LOW (ref 35.0–45.0)
HEMOGLOBIN: 11.3 g/dL — AB (ref 11.7–15.5)
LYMPHS ABS: 2365 {cells}/uL (ref 850–3900)
LYMPHS PCT: 43 %
MCH: 27 pg (ref 27.0–33.0)
MCHC: 33.1 g/dL (ref 32.0–36.0)
MCV: 81.6 fL (ref 80.0–100.0)
MPV: 10.1 fL (ref 7.5–12.5)
Monocytes Absolute: 330 cells/uL (ref 200–950)
Monocytes Relative: 6 %
NEUTROS PCT: 49 %
Neutro Abs: 2695 cells/uL (ref 1500–7800)
Platelets: 208 10*3/uL (ref 140–400)
RBC: 4.18 MIL/uL (ref 3.80–5.10)
RDW: 13.6 % (ref 11.0–15.0)
WBC: 5.5 10*3/uL (ref 3.8–10.8)

## 2016-02-29 LAB — LIPID PANEL
Cholesterol: 168 mg/dL (ref 125–200)
HDL: 64 mg/dL (ref >=46)
LDL CALC: 87 mg/dL (ref <130)
Total CHOL/HDL Ratio: 2.6 Ratio (ref <=5.0)
Triglycerides: 87 mg/dL (ref <150)
VLDL: 17 mg/dL (ref <30)

## 2016-02-29 LAB — COMPREHENSIVE METABOLIC PANEL
ALT: 26 U/L (ref 6–29)
AST: 23 U/L (ref 10–35)
Albumin: 4 g/dL (ref 3.6–5.1)
Alkaline Phosphatase: 64 U/L (ref 33–130)
BUN: 11 mg/dL (ref 7–25)
CALCIUM: 9.1 mg/dL (ref 8.6–10.4)
CO2: 29 mmol/L (ref 20–31)
Chloride: 106 mmol/L (ref 98–110)
Creat: 0.9 mg/dL (ref 0.50–0.99)
GLUCOSE: 88 mg/dL (ref 65–99)
Potassium: 3.8 mmol/L (ref 3.5–5.3)
SODIUM: 143 mmol/L (ref 135–146)
Total Bilirubin: 0.5 mg/dL (ref 0.2–1.2)
Total Protein: 6.2 g/dL (ref 6.1–8.1)

## 2016-02-29 LAB — POCT URINALYSIS DIPSTICK
BILIRUBIN UA: NEGATIVE
Glucose, UA: NEGATIVE
Ketones, UA: NEGATIVE
Leukocytes, UA: NEGATIVE
NITRITE UA: NEGATIVE
Protein, UA: NEGATIVE
Spec Grav, UA: 1.015
Urobilinogen, UA: 0.2
pH, UA: 7.5

## 2016-02-29 MED ORDER — LISINOPRIL-HYDROCHLOROTHIAZIDE 20-12.5 MG PO TABS: 1.0000 | ORAL_TABLET | Freq: Every day | ORAL | Status: DC

## 2016-02-29 MED ORDER — TRAMADOL HCL 50 MG PO TABS: ORAL_TABLET | ORAL | Status: DC

## 2016-02-29 NOTE — Progress Notes (Signed)
Patient ID: Morgan [name], female    DOB: 1951-03-18  Age: 65 y.o. MRN: [id number]    Subjective:  Subjective HPI Morgan [name] presents for f/u tramadol and bp. No complaints.  Review of Systems  Constitutional: Negative for diaphoresis, appetite change, fatigue and unexpected weight change.  Eyes: Negative for pain, redness and visual disturbance.  Respiratory: Negative for cough, chest tightness, shortness of breath and wheezing.   Cardiovascular: Negative for chest pain, palpitations and leg swelling.  Endocrine: Negative for cold intolerance, heat intolerance, polydipsia, polyphagia and polyuria.  Genitourinary: Negative for dysuria, frequency and difficulty urinating.  Neurological: Negative for dizziness, light-headedness, numbness and headaches.    History Past Medical History  Diagnosis Date  . Hypertension   . Asthma   . Low back pain     She has past surgical history that includes Back surgery.   Her family history includes Throat cancer in her sister.She reports that she has never smoked. She has never used smokeless tobacco. She reports that she does not drink alcohol or use illicit drugs.  No current outpatient prescriptions on file prior to visit.   No current facility-administered medications on file prior to visit.     Objective:  Objective Physical Exam  Constitutional: She is oriented to person, place, and time. She appears well-developed and well-nourished.  HENT:  Head: Normocephalic and atraumatic.  Eyes: Conjunctivae and EOM are normal.  Neck: Normal range of motion. Neck supple. No JVD present. Carotid bruit is not present. No thyromegaly present.  Cardiovascular: Normal rate, regular rhythm and normal heart sounds.   No murmur heard. Pulmonary/Chest: Effort normal and breath sounds normal. No respiratory distress. She has no wheezes. She has no rales. She exhibits no tenderness.  Musculoskeletal: She exhibits no edema.  Neurological: She is  alert and oriented to person, place, and time.  Psychiatric: She has a normal mood and affect.  Nursing note and vitals reviewed.  BP 130/66 mmHg  Pulse 65  Temp(Src) 98.6 F (37 C) (Oral)  Ht _0  (1.651 m)  Wt 178 lb 3.2 oz (80.831 kg)  BMI 29.65 kg/m2  SpO2 98% Wt Readings from Last 3 Encounters:  02/29/16 178 lb 3.2 oz (80.831 kg)  03/12/15 180 lb 12.8 oz (82.01 kg)  08/04/14 176 lb 3.2 oz (79.924 kg)     Lab Results  Component Value Date   WBC 5.5 02/29/2016   HGB 11.3* 02/29/2016   HCT 34.1* 02/29/2016   PLT 208 02/29/2016   GLUCOSE 88 02/29/2016   CHOL 168 02/29/2016   TRIG 87 02/29/2016   HDL 64 02/29/2016   LDLCALC 87 02/29/2016   ALT 26 02/29/2016   AST 23 02/29/2016   NA 143 02/29/2016   K 3.8 02/29/2016   CL 106 02/29/2016   CREATININE 0.90 02/29/2016   BUN 11 02/29/2016   CO2 29 02/29/2016   TSH 5.07 06/04/2012    Mm Diag Breast Tomo Uni Right  12/21/2014  CLINICAL DATA:  Call back from screening for a possible right breast mass. EXAM: DIGITAL DIAGNOSTIC RIGHT MAMMOGRAM WITH 3D TOMOSYNTHESIS AND CAD COMPARISON:  Prior exams ACR Breast Density Category b: There are scattered areas of fibroglandular density. FINDINGS: The possible mass seen on screening study is not evident on the follow-up 2D or 3D images. There is no evidence of a mass and no architectural distortion. There are no suspicious calcifications. Mammographic images were processed with CAD. IMPRESSION: Normal exam.  No evidence of malignancy. RECOMMENDATION: Screening mammogram in one  year.(Code:SM-B-01Y) I have discussed the findings and recommendations with the patient. Results were also provided in writing at the conclusion of the visit. If applicable, a reminder letter will be sent to the patient regarding the next appointment. BI-RADS CATEGORY  1: Negative. Electronically Signed   By: Lajean Manes M.D.   On: 12/21/2014 10:21     Assessment & Plan:  Plan I have changed [name]'s  lisinopril-hydrochlorothiazide. I am also having her maintain her traMADol.  Meds ordered this encounter  Medications  . lisinopril-hydrochlorothiazide (PRINZIDE,ZESTORETIC) 20-12.5 MG tablet    Sig: Take 1 tablet by mouth daily.    Dispense:  90 tablet    Refill:  1  . traMADol (ULTRAM) 50 MG tablet    Sig: TAKE 1- 2 TABLETS BY MOUTH EVERY 6 HOURS AS NEEDED    Dispense:  120 tablet    Refill:  1    Problem List Items Addressed This Visit    Essential hypertension - Primary   Relevant Medications   lisinopril-hydrochlorothiazide (PRINZIDE,ZESTORETIC) 20-12.5 MG tablet   Other Relevant Orders   POCT urinalysis dipstick (Completed)   Comp Met (CMET) (Completed)   Lipid panel (Completed)   CBC with Differential (Completed)   LOW BACK PAIN, CHRONIC   Relevant Medications   traMADol (ULTRAM) 50 MG tablet   Other Relevant Orders   POCT urinalysis dipstick (Completed)   Comp Met (CMET) (Completed)   Lipid panel (Completed)   CBC with Differential (Completed)      Follow-up: Return in about 6 months (around 08/31/2016), or if symptoms worsen or fail to improve, for annual exam, fasting.  Ann Held, DO

## 2016-02-29 NOTE — Patient Instructions (Signed)
Hypertension Hypertension, commonly called high blood pressure, is when the force of blood pumping through your arteries is too strong. Your arteries are the blood vessels that carry blood from your heart throughout your body. A blood pressure reading consists of a higher number over a lower number, such as 110/72. The higher number (systolic) is the pressure inside your arteries when your heart pumps. The lower number (diastolic) is the pressure inside your arteries when your heart relaxes. Ideally you want your blood pressure below 120/80. Hypertension forces your heart to work harder to pump blood. Your arteries may become narrow or stiff. Having untreated or uncontrolled hypertension can cause heart attack, stroke, kidney disease, and other problems. RISK FACTORS Some risk factors for high blood pressure are controllable. Others are not.  Risk factors you cannot control include:   Race. You may be at higher risk if you are African American.  Age. Risk increases with age.  Gender. Men are at higher risk than women before age 45 years. After age 65, women are at higher risk than men. Risk factors you can control include:  Not getting enough exercise or physical activity.  Being overweight.  Getting too much fat, sugar, calories, or salt in your diet.  Drinking too much alcohol. SIGNS AND SYMPTOMS Hypertension does not usually cause signs or symptoms. Extremely high blood pressure (hypertensive crisis) may cause headache, anxiety, shortness of breath, and nosebleed. DIAGNOSIS To check if you have hypertension, your health care provider will measure your blood pressure while you are seated, with your arm held at the level of your heart. It should be measured at least twice using the same arm. Certain conditions can cause a difference in blood pressure between your right and left arms. A blood pressure reading that is higher than normal on one occasion does not mean that you need treatment. If  it is not clear whether you have high blood pressure, you may be asked to return on a different day to have your blood pressure checked again. Or, you may be asked to monitor your blood pressure at home for 1 or more weeks. TREATMENT Treating high blood pressure includes making lifestyle changes and possibly taking medicine. Living a healthy lifestyle can help lower high blood pressure. You may need to change some of your habits. Lifestyle changes may include:  Following the DASH diet. This diet is high in fruits, vegetables, and whole grains. It is low in salt, red meat, and added sugars.  Keep your sodium intake below 2,300 mg per day.  Getting at least 30-45 minutes of aerobic exercise at least 4 times per week.  Losing weight if necessary.  Not smoking.  Limiting alcoholic beverages.  Learning ways to reduce stress. Your health care provider may prescribe medicine if lifestyle changes are not enough to get your blood pressure under control, and if one of the following is true:  You are 18-59 years of age and your systolic blood pressure is above 140.  You are 60 years of age or older, and your systolic blood pressure is above 150.  Your diastolic blood pressure is above 90.  You have diabetes, and your systolic blood pressure is over 140 or your diastolic blood pressure is over 90.  You have kidney disease and your blood pressure is above 140/90.  You have heart disease and your blood pressure is above 140/90. Your personal target blood pressure may vary depending on your medical conditions, your age, and other factors. HOME CARE INSTRUCTIONS    Have your blood pressure rechecked as directed by your health care provider.   Take medicines only as directed by your health care provider. Follow the directions carefully. Blood pressure medicines must be taken as prescribed. The medicine does not work as well when you skip doses. Skipping doses also puts you at risk for  problems.  Do not smoke.   Monitor your blood pressure at home as directed by your health care provider. SEEK MEDICAL CARE IF:   You think you are having a reaction to medicines taken.  You have recurrent headaches or feel dizzy.  You have swelling in your ankles.  You have trouble with your vision. SEEK IMMEDIATE MEDICAL CARE IF:  You develop a severe headache or confusion.  You have unusual weakness, numbness, or feel faint.  You have severe chest or abdominal pain.  You vomit repeatedly.  You have trouble breathing. MAKE SURE YOU:   Understand these instructions.  Will watch your condition.  Will get help right away if you are not doing well or get worse.   This information is not intended to replace advice given to you by your health care provider. Make sure you discuss any questions you have with your health care provider.   Document Released: 09/22/2005 Document Revised: 02/06/2015 Document Reviewed: 07/15/2013 Elsevier Interactive Patient Education 2016 Elsevier Inc.  

## 2016-02-29 NOTE — Progress Notes (Signed)
Pre visit review using our clinic review tool, if applicable. No additional management support is needed unless otherwise documented below in the visit note. 

## 2016-04-24 ENCOUNTER — Other Ambulatory Visit: Payer: Self-pay | Admitting: Family Medicine

## 2016-04-24 DIAGNOSIS — Z1231 Encounter for screening mammogram for malignant neoplasm of breast: Secondary | ICD-10-CM

## 2016-05-02 ENCOUNTER — Other Ambulatory Visit: Payer: Self-pay | Admitting: Family Medicine

## 2016-05-02 DIAGNOSIS — M545 Low back pain: Secondary | ICD-10-CM

## 2016-05-02 NOTE — Telephone Encounter (Signed)
Last seen 02/29/16 and filled 02/29/16 #120 with 1 rf UDS 04/28/14 Low risk   Please advise     KP

## 2016-05-02 NOTE — Telephone Encounter (Signed)
Pt is requesting a refill for Tramadol

## 2016-05-14 ENCOUNTER — Ambulatory Visit: Payer: Medicare Other

## 2016-06-07 ENCOUNTER — Other Ambulatory Visit: Payer: Self-pay | Admitting: Family Medicine

## 2016-06-07 DIAGNOSIS — M545 Low back pain: Secondary | ICD-10-CM

## 2016-06-10 NOTE — Telephone Encounter (Signed)
Rx faxed.    KP 

## 2016-06-10 NOTE — Telephone Encounter (Signed)
Patient is calling is out of meds, call back # [phone]

## 2016-06-10 NOTE — Telephone Encounter (Signed)
Last seen 02/29/16 and filled 05/02/2016 #120 UDS 04/28/14 low risk   Please advise     KP

## 2016-06-13 ENCOUNTER — Ambulatory Visit (INDEPENDENT_AMBULATORY_CARE_PROVIDER_SITE_OTHER): Payer: Medicare Other | Admitting: Family Medicine

## 2016-06-13 VITALS — BP 131/74 | HR 66 | Temp 98.8°F | Ht 65.0 in | Wt 177.4 lb

## 2016-06-13 DIAGNOSIS — M549 Dorsalgia, unspecified: Secondary | ICD-10-CM

## 2016-06-13 DIAGNOSIS — G8929 Other chronic pain: Secondary | ICD-10-CM | POA: Diagnosis not present

## 2016-06-13 NOTE — Patient Instructions (Signed)

## 2016-06-13 NOTE — Progress Notes (Signed)
Pre visit review using our clinic review tool, if applicable. No additional management support is needed unless otherwise documented below in the visit note. 

## 2016-06-15 ENCOUNTER — Encounter: Payer: Self-pay | Admitting: Family Medicine

## 2016-06-15 NOTE — Assessment & Plan Note (Signed)
Refill ultram picked up a few days earlier F/u 6 months

## 2016-06-15 NOTE — Progress Notes (Signed)
Patient ID: Morgan [name], female    DOB: [birth year]  Age: 65 y.o. MRN: [id number]    Subjective:  Subjective  HPI Morgan [name] presents for f/u back pain and ultram---- she does well with ultram No new complaints.    Review of Systems  Constitutional: Negative for appetite change, diaphoresis, fatigue and unexpected weight change.  Eyes: Negative for pain, redness and visual disturbance.  Respiratory: Negative for cough, chest tightness, shortness of breath and wheezing.   Cardiovascular: Negative for chest pain, palpitations and leg swelling.  Endocrine: Negative for cold intolerance, heat intolerance, polydipsia, polyphagia and polyuria.  Genitourinary: Negative for difficulty urinating, dysuria and frequency.  Musculoskeletal: Positive for arthralgias and back pain.  Neurological: Negative for dizziness, light-headedness, numbness and headaches.    History Past Medical History:  Diagnosis Date  . Asthma   . Hypertension   . Low back pain     She has a past surgical history that includes Back surgery.   Her family history includes Throat cancer in her sister.She reports that she has never smoked. She has never used smokeless tobacco. She reports that she does not drink alcohol or use drugs.  Current Outpatient Prescriptions on File Prior to Visit  Medication Sig Dispense Refill  . lisinopril-hydrochlorothiazide (PRINZIDE,ZESTORETIC) 20-12.5 MG tablet Take 1 tablet by mouth daily. 90 tablet 1  . traMADol (ULTRAM) 50 MG tablet TAKE 1 TO 2 TABLETS BY MOUTH EVERY 6 HOURS AS NEEDED 120 tablet 0   No current facility-administered medications on file prior to visit.      Objective:  Objective  Physical Exam  Constitutional: She is oriented to person, place, and time. She appears well-developed and well-nourished.  HENT:  Head: Normocephalic and atraumatic.  Eyes: Conjunctivae and EOM are normal.  Neck: Normal range of motion. Neck supple. No JVD present. Carotid bruit  is not present. No thyromegaly present.  Cardiovascular: Normal rate, regular rhythm and normal heart sounds.   No murmur heard. Pulmonary/Chest: Effort normal and breath sounds normal. No respiratory distress. She has no wheezes. She has no rales. She exhibits no tenderness.  Musculoskeletal: She exhibits no edema.  Neurological: She is alert and oriented to person, place, and time.  Psychiatric: She has a normal mood and affect.  Nursing note and vitals reviewed.  BP 131/74 (BP Location: Right Arm, Patient Position: Sitting, Cuff Size: Normal)   Pulse 66   Temp 98.8 F (37.1 C) (Oral)   Ht 5\' 5"  (1.651 m)   Wt 177 lb 6.4 oz (80.5 kg)   SpO2 98%   BMI 29.52 kg/m  Wt Readings from Last 3 Encounters:  06/13/16 177 lb 6.4 oz (80.5 kg)  02/29/16 178 lb 3.2 oz (80.8 kg)  03/12/15 180 lb 12.8 oz (82 kg)     Lab Results  Component Value Date   WBC 5.5 02/29/2016   HGB 11.3 (L) 02/29/2016   HCT 34.1 (L) 02/29/2016   PLT 208 02/29/2016   GLUCOSE 88 02/29/2016   CHOL 168 02/29/2016   TRIG 87 02/29/2016   HDL 64 02/29/2016   LDLCALC 87 02/29/2016   ALT 26 02/29/2016   AST 23 02/29/2016   NA 143 02/29/2016   K 3.8 02/29/2016   CL 106 02/29/2016   CREATININE 0.90 02/29/2016   BUN 11 02/29/2016   CO2 29 02/29/2016   TSH 5.07 06/04/2012    Mm Diag Breast Tomo Uni Right  Result Date: 12/21/2014 CLINICAL DATA:  Call back from screening for a possible right  breast mass. EXAM: DIGITAL DIAGNOSTIC RIGHT MAMMOGRAM WITH 3D TOMOSYNTHESIS AND CAD COMPARISON:  Prior exams ACR Breast Density Category b: There are scattered areas of fibroglandular density. FINDINGS: The possible mass seen on screening study is not evident on the follow-up 2D or 3D images. There is no evidence of a mass and no architectural distortion. There are no suspicious calcifications. Mammographic images were processed with CAD. IMPRESSION: Normal exam.  No evidence of malignancy. RECOMMENDATION: Screening mammogram in  one year.(Code:SM-B-01Y) I have discussed the findings and recommendations with the patient. Results were also provided in writing at the conclusion of the visit. If applicable, a reminder letter will be sent to the patient regarding the next appointment. BI-RADS CATEGORY  1: Negative. Electronically Signed   By: Amie Portland M.D.   On: 12/21/2014 10:21     Assessment & Plan:  Plan  I am having Morgan [name] maintain her lisinopril-hydrochlorothiazide and traMADol.  No orders of the defined types were placed in this encounter.   Problem List Items Addressed This Visit    None    Visit Diagnoses    Chronic back pain    -  Primary    ultram refilled a few days prior rto 6 months or sooner prn  Follow-up: Return in about 6 months (around 12/11/2016) for annual exam, fasting.  Donato Schultz, DO

## 2016-06-18 DIAGNOSIS — Z1231 Encounter for screening mammogram for malignant neoplasm of breast: Secondary | ICD-10-CM | POA: Diagnosis not present

## 2016-06-18 DIAGNOSIS — M8580 Other specified disorders of bone density and structure, unspecified site: Secondary | ICD-10-CM | POA: Diagnosis not present

## 2016-06-18 LAB — HM DEXA SCAN

## 2016-06-18 LAB — HM MAMMOGRAPHY

## 2016-07-07 ENCOUNTER — Other Ambulatory Visit: Payer: Self-pay | Admitting: Family Medicine

## 2016-07-07 DIAGNOSIS — M545 Low back pain: Secondary | ICD-10-CM

## 2016-07-09 ENCOUNTER — Telehealth: Payer: Self-pay | Admitting: Family Medicine

## 2016-07-09 NOTE — Telephone Encounter (Signed)
Relation to WU:JWJXpt:self Call back number:[phone]   Reason for call:  Patient requesting a refill traMADol (ULTRAM) 50 MG tablet, patient has 2 pills left and currently taking midterms.

## 2016-07-10 NOTE — Telephone Encounter (Signed)
Tramadol was sent to the pharmacy on 07/07/16 #120 please advise   KP

## 2016-07-11 NOTE — Telephone Encounter (Signed)
Rx called in.    KP 

## 2016-07-11 NOTE — Telephone Encounter (Signed)
Relation to WU:JWJXpt:self Call back number:[phone] Pharmacy: [facility] [id number] - [place], [place] - [address] & [address] [phone] (Phone) [phone] (Fax)    Reason for call:  Patient states as per pharmacy never received prescription and would like you to call pharmacy directly and speak with a live person. Please advise

## 2016-07-15 DIAGNOSIS — R197 Diarrhea, unspecified: Secondary | ICD-10-CM | POA: Diagnosis not present

## 2016-07-15 DIAGNOSIS — K900 Celiac disease: Secondary | ICD-10-CM | POA: Diagnosis not present

## 2016-07-15 DIAGNOSIS — Z1211 Encounter for screening for malignant neoplasm of colon: Secondary | ICD-10-CM | POA: Diagnosis not present

## 2016-08-11 ENCOUNTER — Other Ambulatory Visit: Payer: Self-pay | Admitting: Family Medicine

## 2016-08-11 DIAGNOSIS — I10 Essential (primary) hypertension: Secondary | ICD-10-CM

## 2016-08-11 DIAGNOSIS — M545 Low back pain: Secondary | ICD-10-CM

## 2016-09-02 ENCOUNTER — Encounter: Payer: Medicare Other | Admitting: Family Medicine

## 2016-09-10 ENCOUNTER — Other Ambulatory Visit: Payer: Self-pay | Admitting: Family Medicine

## 2016-09-10 DIAGNOSIS — M545 Low back pain: Secondary | ICD-10-CM

## 2016-09-11 ENCOUNTER — Other Ambulatory Visit: Payer: Self-pay | Admitting: Family Medicine

## 2016-09-11 ENCOUNTER — Telehealth: Payer: Self-pay

## 2016-09-11 DIAGNOSIS — M545 Low back pain: Secondary | ICD-10-CM

## 2016-09-11 NOTE — Telephone Encounter (Signed)
Received request for Tramadol 50mg  (take 1-2 tablets by mouth daily).  Last Ov:06/13/2016 Last Rf: 08/11/2016 Next Ov: 12/16/2016 UDS: 04/28/14 no controlled substance contract signed, uds sample given Low risk, next screen 10/29/14.  Printed Rx and forwarded to Provider for review and signature.

## 2016-09-11 NOTE — Telephone Encounter (Signed)
Rx faxed. LB 

## 2016-09-12 NOTE — Telephone Encounter (Signed)
Last seen 06/13/16. Next appt scheduled on 12/16/16  Rx printed at desk for your signature

## 2016-09-12 NOTE — Telephone Encounter (Signed)
Rx faxed to pharmacy  

## 2016-09-23 ENCOUNTER — Telehealth: Payer: Self-pay | Admitting: Family Medicine

## 2016-09-23 ENCOUNTER — Other Ambulatory Visit: Payer: Self-pay

## 2016-09-23 DIAGNOSIS — I10 Essential (primary) hypertension: Secondary | ICD-10-CM

## 2016-09-23 MED ORDER — LISINOPRIL-HYDROCHLOROTHIAZIDE 20-12.5 MG PO TABS: 1.0000 | ORAL_TABLET | Freq: Every day | ORAL | 0 refills | Status: DC

## 2016-09-23 NOTE — Telephone Encounter (Signed)
Caller name: Romero LinerDorothy Georgiades Relationship to patient: self Can be reached: [phone] [id number] [place] [address] [place] - [phone] AT [address] & [place]  Reason for call: Pt went out of town and forgot meds. She had a few in her purse but took last one today. She is needing a 1 month supply Lisinopril sent to pharmacy in [place] please.

## 2016-09-23 NOTE — Telephone Encounter (Signed)
Rx sent to pharmacy, per pt's request. Made pt aware Rx is ready. LB

## 2016-10-10 ENCOUNTER — Telehealth: Payer: Self-pay | Admitting: Family Medicine

## 2016-10-10 NOTE — Telephone Encounter (Signed)
Relation to QM:VHQIpt:self Call back number:[phone]   Reason for call:  Patient requesting a refill traMADol (ULTRAM) 50 MG tablet

## 2016-10-13 ENCOUNTER — Other Ambulatory Visit: Payer: Self-pay | Admitting: Family Medicine

## 2016-10-13 DIAGNOSIS — M545 Low back pain: Secondary | ICD-10-CM

## 2016-10-13 MED ORDER — TRAMADOL HCL 50 MG PO TABS: 50.0000 mg | ORAL_TABLET | Freq: Four times a day (QID) | ORAL | 1 refills | Status: DC | PRN

## 2016-10-13 NOTE — Telephone Encounter (Signed)
printed

## 2016-10-13 NOTE — Telephone Encounter (Signed)
Rx sent to pharmacy. LB 

## 2016-10-13 NOTE — Telephone Encounter (Signed)
Patient calling back to follow up on request for refill.

## 2016-10-13 NOTE — Telephone Encounter (Signed)
Relation to ZO:XWRUpt:self Call back [phone]   Reason for call:  Patient checking on the status of medication refill mentioned below, please advise

## 2016-10-13 NOTE — Telephone Encounter (Signed)
Tramadol 50 mg tablet Sig: Take 1-2 tablets by mouth every 6 hours as needed Last filled: 09/12/16 Amt: 120, 0 Last OV:  06/13/16 Next appt: 12/16/16 for CPE Needs UDS and CSC  Please advise.

## 2016-10-14 ENCOUNTER — Telehealth: Payer: Self-pay | Admitting: Family Medicine

## 2016-10-14 NOTE — Telephone Encounter (Signed)
Patient sent in a message through MyChart scheduling stating that when she went to her internal medicine appointment they told her she only had to have a colonoscopy every 10 years and she thought that was strange. FYI

## 2016-10-15 NOTE — Telephone Encounter (Signed)
Patient notified.  I read the actual report that was scanned in and it stated: Repeat exam in 5 years, in case the patient has any abnormal GI symptoms in the interim, she should contact the office immediately for further recommendations.  Her GI office was right for every 10 years.  Quality metric corrected in her chart for every 10 years instead of every 5 years.

## 2016-11-06 DIAGNOSIS — Z23 Encounter for immunization: Secondary | ICD-10-CM | POA: Diagnosis not present

## 2016-12-12 NOTE — Progress Notes (Signed)
Subjective:   Morgan [name] is a 66 y.o. female who presents for an Initial Medicare Annual Wellness Visit.  The Patient was informed that the wellness visit is to identify future health risk and educate and initiate measures that can reduce risk for increased disease through the lifespan.   Review of Systems    No ROS.  Medicare Wellness Visit. Cardiac Risk Factors include: advanced age (>69men, >44 women);hypertension;sedentary lifestyle Sleep patterns: pt states she stays up late and then wakes up late.  Home Safety/Smoke Alarms:  Feels safe in home. Smoke alarms in place.  Living environment; residence and Firearm Safety: Granddaughter lives with her. No guns. Seat Belt Safety/Bike Helmet: Wears seat belt.   Counseling:   Eye Exam- Wears glasses. Eye doctor annually. Dental- Dr.Zigglar every 6 months.  Female:   Pap- Dr.Dillard annually.      Mammo- Last 12/21/14: BI-RADS CATEGORY  1: Negative.   Will request records.    Dexa scan-  Will request records.    CCS- Last 07/07/11: Dr.Mann-repeat in 5 yrs (external report). Pt states she will make appointment.     Objective:    Today's Vitals   12/16/16 1312  BP: 130/70  Pulse: 75  SpO2: 98%  Weight: 176 lb (79.8 kg)  Height: 5\' 5"  (1.651 m)   Body mass index is 29.29 kg/m.   Current Medications (verified) Outpatient Encounter Prescriptions as of 12/16/2016  Medication Sig  . lisinopril-hydrochlorothiazide (PRINZIDE,ZESTORETIC) 20-12.5 MG tablet Take 1 tablet by mouth daily.  . traMADol (ULTRAM) 50 MG tablet TAKE 1-2 TABLETS BY MOUTH EVERY 6 HOURS AS NEEDED  . [DISCONTINUED] traMADol (ULTRAM) 50 MG tablet Take 1-2 tablets (50-100 mg total) by mouth every 6 (six) hours as needed.   No facility-administered encounter medications on file as of 12/16/2016.     Allergies (verified) Alupent [metaproterenol] and Hydrocodone-acetaminophen   History: Past Medical History:  Diagnosis Date  . Asthma   . Hypertension   .  Low back pain    Past Surgical History:  Procedure Laterality Date  . BACK SURGERY     Low Back   Family History  Problem Relation Age of Onset  . Cancer    . Throat cancer Sister   . Stroke    . Hypertension     Social History   Occupational History  . sub  [employer]   Social History Main Topics  . Smoking status: Former Games developer  . Smokeless tobacco: Never Used  . Alcohol use No  . Drug use: No  . Sexual activity: No    Tobacco Counseling Counseling given: No   Activities of Daily Living In your present state of health, do you have any difficulty performing the following activities: 12/16/2016 06/13/2016  Hearing? N Y  Vision? N N  Difficulty concentrating or making decisions? N Y  Walking or climbing stairs? N N  Dressing or bathing? N N  Doing errands, shopping? N N  Preparing Food and eating ? N -  Using the Toilet? N -  In the past six months, have you accidently leaked urine? N -  Do you have problems with loss of bowel control? N -  Managing your Medications? N -  Managing your Finances? N -  Housekeeping or managing your Housekeeping? N -  Some recent data might be hidden    Immunizations and Health Maintenance Immunization History  Administered Date(s) Administered  . Influenza Whole 09/01/2007, 09/17/2009  . Influenza-Unspecified 11/06/2016  . PPD Test  05/14/2012  . Tdap 05/14/2012  . Zoster 10/20/2013   Health Maintenance Due  Topic Date Due  . Hepatitis C Screening  08-31-1951    Patient Care Team: Donato Schultz, DO as PCP - General  Indicate any recent Medical Services you may have received from other than [facility] providers in the past year (date may be approximate).     Assessment:   This is a routine wellness examination for Morgan [name]. Physical assessment deferred to PCP.  Hearing/Vision screen  Hearing Screening   125Hz  250Hz  500Hz  1000Hz  2000Hz  3000Hz  4000Hz  6000Hz  8000Hz   Right ear:   Pass Pass Pass  Pass    Left  ear:   Pass Pass Pass  Pass      Visual Acuity Screening   Right eye Left eye Both eyes  Without correction:     With correction: 20/20 20/20 20/20     Dietary issues and exercise activities discussed: Current Exercise Habits: The patient does not participate in regular exercise at present, Exercise limited by: None identified   Diet (meal preparation, eat out, water intake, caffeinated beverages, dairy products, fruits and vegetables): in general, a "healthy" diet  , on average, 3 meals per day      Goals    . Work out 3x/week for      Depression Screen PHQ 2/9 Scores 12/16/2016 02/29/2016  PHQ - 2 Score 0 0    Fall Risk Fall Risk  12/16/2016 02/29/2016  Falls in the past year? No No    Cognitive Function: Ad8 score reviewed for issues:  Issues making decisions:no  Less interest in hobbies / activities:no  Repeats questions, stories (family complaining):no  Trouble using ordinary gadgets (microwave, computer, phone):no  Forgets the month or year: no  Mismanaging finances: no  Remembering appts:no  Daily problems with thinking and/or memory:no Ad8 score is=0            Screening Tests Health Maintenance  Topic Date Due  . Hepatitis C Screening  September 22, 1951  . MAMMOGRAM  01/16/2017 (Originally 11/29/2016)  . DEXA SCAN  01/16/2017 (Originally 03/31/2015)  . PNA vac Low Risk Adult (1 of 2 - PCV13) 02/28/2017 (Originally 03/31/2015)  . COLONOSCOPY  07/06/2021  . TETANUS/TDAP  05/14/2022  . INFLUENZA VACCINE  Completed      Plan:     Continue to eat heart healthy diet (full of fruits, vegetables, whole grains, lean protein, water--limit salt, fat, and sugar intake) and increase physical activity as tolerated.  Keep up the great work with [employer] school. VERY IMPRESSIVE!  Schedule colonoscopy.  During the course of the visit, Morgan [name] was educated and counseled about the following appropriate screening and preventive services:   Vaccines to include  Pneumoccal, Influenza, Hepatitis B, Td, HCV  Cardiovascular disease screening  Colorectal cancer screening  Bone density screening  Diabetes screening  Glaucoma screening  Mammography/PAP  Nutrition counseling   Patient Instructions (the written plan) were given to the patient.    [place]on Gully, [place]   12/16/2016

## 2016-12-12 NOTE — Progress Notes (Signed)
Pre visit review using our clinic review tool, if applicable. No additional management support is needed unless otherwise documented below in the visit note. 

## 2016-12-15 ENCOUNTER — Other Ambulatory Visit: Payer: Self-pay | Admitting: Family Medicine

## 2016-12-15 DIAGNOSIS — M545 Low back pain: Secondary | ICD-10-CM

## 2016-12-16 ENCOUNTER — Encounter: Payer: Medicare Other | Admitting: Family Medicine

## 2016-12-16 ENCOUNTER — Ambulatory Visit (INDEPENDENT_AMBULATORY_CARE_PROVIDER_SITE_OTHER): Payer: Medicare Other | Admitting: Family Medicine

## 2016-12-16 ENCOUNTER — Encounter: Payer: Self-pay | Admitting: Family Medicine

## 2016-12-16 VITALS — BP 130/70 | HR 75 | Resp 16 | Ht 65.0 in | Wt 176.0 lb

## 2016-12-16 DIAGNOSIS — Z23 Encounter for immunization: Secondary | ICD-10-CM | POA: Diagnosis not present

## 2016-12-16 DIAGNOSIS — Z0000 Encounter for general adult medical examination without abnormal findings: Secondary | ICD-10-CM

## 2016-12-16 DIAGNOSIS — D638 Anemia in other chronic diseases classified elsewhere: Secondary | ICD-10-CM

## 2016-12-16 DIAGNOSIS — K900 Celiac disease: Secondary | ICD-10-CM | POA: Diagnosis not present

## 2016-12-16 DIAGNOSIS — I10 Essential (primary) hypertension: Secondary | ICD-10-CM

## 2016-12-16 MED ORDER — ZOSTER VAC RECOMB ADJUVANTED 50 MCG/0.5ML IM SUSR: 50.0000 ug | Freq: Once | INTRAMUSCULAR | 1 refills | Status: AC

## 2016-12-16 NOTE — Patient Instructions (Addendum)
Schedule appointment for colonoscopy.  Continue to eat heart healthy diet (full of fruits, vegetables, whole grains, lean protein, water--limit salt, fat, and sugar intake) and increase physical activity as tolerated.  Keep up the great work with [employer] school. VERY IMPRESSIVE! Hypertension Hypertension, commonly called high blood pressure, is when the force of blood pumping through the arteries is too strong. The arteries are the blood vessels that carry blood from the heart throughout the body. Hypertension forces the heart to work harder to pump blood and may cause arteries to become narrow or stiff. Having untreated or uncontrolled hypertension can cause heart attacks, strokes, kidney disease, and other problems. A blood pressure reading consists of a higher number over a lower number. Ideally, your blood pressure should be below 120/80. The first ("top") number is called the systolic pressure. It is a measure of the pressure in your arteries as your heart beats. The second ("bottom") number is called the diastolic pressure. It is a measure of the pressure in your arteries as the heart relaxes. What are the causes? The cause of this condition is not known. What increases the risk? Some risk factors for high blood pressure are under your control. Others are not. Factors you can change   Smoking.  Having type 2 diabetes mellitus, high cholesterol, or both.  Not getting enough exercise or physical activity.  Being overweight.  Having too much fat, sugar, calories, or salt (sodium) in your diet.  Drinking too much alcohol. Factors that are difficult or impossible to change   Having chronic kidney disease.  Having a family history of high blood pressure.  Age. Risk increases with age.  Race. You may be at higher risk if you are African-American.  Gender. Men are at higher risk than women before age 48. After age 19, women are at higher risk than men.  Having obstructive sleep  apnea.  Stress. What are the signs or symptoms? Extremely high blood pressure (hypertensive crisis) may cause:  Headache.  Anxiety.  Shortness of breath.  Nosebleed.  Nausea and vomiting.  Severe chest pain.  Jerky movements you cannot control (seizures). How is this diagnosed? This condition is diagnosed by measuring your blood pressure while you are seated, with your arm resting on a surface. The cuff of the blood pressure monitor will be placed directly against the skin of your upper arm at the level of your heart. It should be measured at least twice using the same arm. Certain conditions can cause a difference in blood pressure between your right and left arms. Certain factors can cause blood pressure readings to be lower or higher than normal (elevated) for a short period of time:  When your blood pressure is higher when you are in a health care provider's office than when you are at home, this is called white coat hypertension. Most people with this condition do not need medicines.  When your blood pressure is higher at home than when you are in a health care provider's office, this is called masked hypertension. Most people with this condition may need medicines to control blood pressure. If you have a high blood pressure reading during one visit or you have normal blood pressure with other risk factors:  You may be asked to return on a different day to have your blood pressure checked again.  You may be asked to monitor your blood pressure at home for 1 week or longer. If you are diagnosed with hypertension, you may have other blood or  imaging tests to help your health care provider understand your overall risk for other conditions. How is this treated? This condition is treated by making healthy lifestyle changes, such as eating healthy foods, exercising more, and reducing your alcohol intake. Your health care provider may prescribe medicine if lifestyle changes are not  enough to get your blood pressure under control, and if:  Your systolic blood pressure is above 130.  Your diastolic blood pressure is above 80. Your personal target blood pressure may vary depending on your medical conditions, your age, and other factors. Follow these instructions at home: Eating and drinking   Eat a diet that is high in fiber and potassium, and low in sodium, added sugar, and fat. An example eating plan is called the DASH (Dietary Approaches to Stop Hypertension) diet. To eat this way:  Eat plenty of fresh fruits and vegetables. Try to fill half of your plate at each meal with fruits and vegetables.  Eat whole grains, such as whole wheat pasta, brown rice, or whole grain bread. Fill about one quarter of your plate with whole grains.  Eat or drink low-fat dairy products, such as skim milk or low-fat yogurt.  Avoid fatty cuts of meat, processed or cured meats, and poultry with skin. Fill about one quarter of your plate with lean proteins, such as fish, chicken without skin, beans, eggs, and tofu.  Avoid premade and processed foods. These tend to be higher in sodium, added sugar, and fat.  Reduce your daily sodium intake. Most people with hypertension should eat less than 1,500 mg of sodium a day.  Limit alcohol intake to no more than 1 drink a day for nonpregnant women and 2 drinks a day for men. One drink equals 12 oz of beer, 5 oz of wine, or 1 oz of hard liquor. Lifestyle   Work with your health care provider to maintain a healthy body weight or to lose weight. Ask what an ideal weight is for you.  Get at least 30 minutes of exercise that causes your heart to beat faster (aerobic exercise) most days of the week. Activities may include walking, swimming, or biking.  Include exercise to strengthen your muscles (resistance exercise), such as pilates or lifting weights, as part of your weekly exercise routine. Try to do these types of exercises for 30 minutes at least 3  days a week.  Do not use any products that contain nicotine or tobacco, such as cigarettes and e-cigarettes. If you need help quitting, ask your health care provider.  Monitor your blood pressure at home as told by your health care provider.  Keep all follow-up visits as told by your health care provider. This is important. Medicines   Take over-the-counter and prescription medicines only as told by your health care provider. Follow directions carefully. Blood pressure medicines must be taken as prescribed.  Do not skip doses of blood pressure medicine. Doing this puts you at risk for problems and can make the medicine less effective.  Ask your health care provider about side effects or reactions to medicines that you should watch for. Contact a health care provider if:  You think you are having a reaction to a medicine you are taking.  You have headaches that keep coming back (recurring).  You feel dizzy.  You have swelling in your ankles.  You have trouble with your vision. Get help right away if:  You develop a severe headache or confusion.  You have unusual weakness or numbness.  You feel faint.  You have severe pain in your chest or abdomen.  You vomit repeatedly.  You have trouble breathing. Summary  Hypertension is when the force of blood pumping through your arteries is too strong. If this condition is not controlled, it may put you at risk for serious complications.  Your personal target blood pressure may vary depending on your medical conditions, your age, and other factors. For most people, a normal blood pressure is less than 120/80.  Hypertension is treated with lifestyle changes, medicines, or a combination of both. Lifestyle changes include weight loss, eating a healthy, low-sodium diet, exercising more, and limiting alcohol. This information is not intended to replace advice given to you by your health care provider. Make sure you discuss any questions  you have with your health care provider. Document Released: 09/22/2005 Document Revised: 08/20/2016 Document Reviewed: 08/20/2016 Elsevier Interactive Patient Education  2017 [employer]

## 2016-12-16 NOTE — Progress Notes (Signed)
Subjective:  I acted as a Neurosurgeon for Dr. Delman Kitten, LPN    Patient ID: Morgan [name], female    DOB: 08-07-1951, 66 y.o.   MRN: [id number]  Chief Complaint  Patient presents with  . Medicare Wellness  . Follow-up    Hypertension  This is a chronic problem. The problem is controlled. Pertinent negatives include no blurred vision, chest pain, headaches or palpitations.    Patient is in today for follow up hypertension. Patient noticed mild breath smells after changing to gluten free diet. Provider recommend taking a probiotic, using a tongue blade and a good mouth wash. Patient report she need a refill for Tramadol and she use it to control her lower back pain.  Patient Care Team: Donato Schultz, DO as PCP - General   Past Medical History:  Diagnosis Date  . Asthma   . Hypertension   . Low back pain     Past Surgical History:  Procedure Laterality Date  . BACK SURGERY     Low Back    Family History  Problem Relation Age of Onset  . Cancer    . Throat cancer Sister   . Stroke    . Hypertension      Social History   Social History  . Marital status: Married    Spouse name: N/A  . Number of children: N/A  . Years of education: N/A   Occupational History  . sub  [employer]   Social History Main Topics  . Smoking status: Former Games developer  . Smokeless tobacco: Never Used  . Alcohol use No  . Drug use: No  . Sexual activity: No   Other Topics Concern  . Not on file   Social History Narrative  . No narrative on file    Outpatient Medications Prior to Visit  Medication Sig Dispense Refill  . lisinopril-hydrochlorothiazide (PRINZIDE,ZESTORETIC) 20-12.5 MG tablet Take 1 tablet by mouth daily. 90 tablet 0  . traMADol (ULTRAM) 50 MG tablet TAKE 1-2 TABLETS BY MOUTH EVERY 6 HOURS AS NEEDED 120 tablet 0   No facility-administered medications prior to visit.     Allergies  Allergen Reactions  . Alupent [Metaproterenol] Shortness Of Breath     Asthma Flare-up  . Hydrocodone-Acetaminophen Itching    Review of Systems  Constitutional: Negative for fever.  HENT: Negative for congestion.   Eyes: Negative for blurred vision.  Respiratory: Negative for cough.   Cardiovascular: Negative for chest pain and palpitations.  Gastrointestinal: Negative for vomiting.  Musculoskeletal: Negative for back pain.  Skin: Negative for rash.  Neurological: Negative for loss of consciousness and headaches.       Objective:    Physical Exam  Constitutional: She is oriented to person, place, and time. She appears well-developed and well-nourished. No distress.  HENT:  Head: Normocephalic and atraumatic.  Eyes: Conjunctivae are normal. Pupils are equal, round, and reactive to light.  Neck: Normal range of motion. No thyromegaly present.  Cardiovascular: Normal rate and regular rhythm.   Pulmonary/Chest: Effort normal and breath sounds normal. She has no wheezes.  Abdominal: Soft. Bowel sounds are normal. There is no tenderness.  Musculoskeletal: Normal range of motion. She exhibits no edema or deformity.  Neurological: She is alert and oriented to person, place, and time.  Skin: Skin is warm and dry. She is not diaphoretic.  Psychiatric: She has a normal mood and affect.    BP 130/70 (BP Location: Right Arm, Patient Position: Sitting, Cuff Size:  Normal)   Pulse 75   Resp 16   Ht 5\' 5"  (1.651 m)   Wt 176 lb (79.8 kg)   SpO2 98%   BMI 29.29 kg/m  Wt Readings from Last 3 Encounters:  12/16/16 176 lb (79.8 kg)  06/13/16 177 lb 6.4 oz (80.5 kg)  02/29/16 178 lb 3.2 oz (80.8 kg)     Lab Results  Component Value Date   WBC 5.5 02/29/2016   HGB 11.3 (L) 02/29/2016   HCT 34.1 (L) 02/29/2016   PLT 208 02/29/2016   GLUCOSE 88 02/29/2016   CHOL 168 02/29/2016   TRIG 87 02/29/2016   HDL 64 02/29/2016   LDLCALC 87 02/29/2016   ALT 26 02/29/2016   AST 23 02/29/2016   NA 143 02/29/2016   K 3.8 02/29/2016   CL 106 02/29/2016    CREATININE 0.90 02/29/2016   BUN 11 02/29/2016   CO2 29 02/29/2016   TSH 5.07 06/04/2012    Lab Results  Component Value Date   TSH 5.07 06/04/2012   Lab Results  Component Value Date   WBC 5.5 02/29/2016   HGB 11.3 (L) 02/29/2016   HCT 34.1 (L) 02/29/2016   MCV 81.6 02/29/2016   PLT 208 02/29/2016   Lab Results  Component Value Date   NA 143 02/29/2016   K 3.8 02/29/2016   CO2 29 02/29/2016   GLUCOSE 88 02/29/2016   BUN 11 02/29/2016   CREATININE 0.90 02/29/2016   BILITOT 0.5 02/29/2016   ALKPHOS 64 02/29/2016   AST 23 02/29/2016   ALT 26 02/29/2016   PROT 6.2 02/29/2016   ALBUMIN 4.0 02/29/2016   CALCIUM 9.1 02/29/2016   GFR 85.81 06/04/2012   Lab Results  Component Value Date   CHOL 168 02/29/2016   Lab Results  Component Value Date   HDL 64 02/29/2016   Lab Results  Component Value Date   LDLCALC 87 02/29/2016   Lab Results  Component Value Date   TRIG 87 02/29/2016   Lab Results  Component Value Date   CHOLHDL 2.6 02/29/2016   No results found for: HGBA1C     Assessment & Plan:   Problem List Items Addressed This Visit      Unprioritized   Anemia, chronic disease    May be due to celiac disease  Check labs      Celiac disease/sprue    Pt c/o bad breath from diet changes-- - d/w pt mouth wash, tongue blade and probitics       Essential hypertension - Primary    Well controlled, no changes to meds. Encouraged heart healthy diet such as the DASH diet and exercise as tolerated.       Relevant Orders   CBC   Comprehensive metabolic panel   Lipid panel    Other Visit Diagnoses    Encounter for Medicare annual wellness exam          I am having Ms. Coen start on Zoster Vac Recomb Adjuvanted. I am also having her maintain her lisinopril-hydrochlorothiazide and traMADol.  Meds ordered this encounter  Medications  . Zoster Vac Recomb Adjuvanted (SHINGRIX) 50 MCG SUSR    Sig: Inject 50 mcg into the muscle once.    Dispense:  1  each    Refill:  1    CMA served as scribe during this visit. History, Physical and Plan performed by medical provider. Documentation and orders reviewed and attested to.  Donato Schultz, DO  Patient ID:  Morgan [name], female   DOB: 1951/01/31, 66 y.o.   MRN: [id number]

## 2016-12-16 NOTE — Assessment & Plan Note (Signed)
Pt c/o bad breath from diet changes-- - d/w pt mouth wash, tongue blade and probitics

## 2016-12-16 NOTE — Assessment & Plan Note (Signed)
May be due to celiac disease  Check labs

## 2016-12-16 NOTE — Assessment & Plan Note (Signed)
Well controlled, no changes to meds. Encouraged heart healthy diet such as the DASH diet and exercise as tolerated.  °

## 2016-12-16 NOTE — Telephone Encounter (Signed)
Requesting:   tramadol Contract   None UDS     Low risk--due Last OV   06/13/2016--next appt is today 12/16/16 Last Refill    #120 with 1 refill on 10/13/16  Please Advise

## 2016-12-16 NOTE — Telephone Encounter (Signed)
Faxed hardcopy for Tramadol to [facility] in [place] 

## 2016-12-18 ENCOUNTER — Encounter: Payer: Self-pay | Admitting: Family Medicine

## 2016-12-18 DIAGNOSIS — Z79891 Long term (current) use of opiate analgesic: Secondary | ICD-10-CM | POA: Diagnosis not present

## 2016-12-22 ENCOUNTER — Encounter: Payer: Self-pay | Admitting: *Deleted

## 2017-01-15 ENCOUNTER — Other Ambulatory Visit: Payer: Self-pay | Admitting: Family Medicine

## 2017-01-15 DIAGNOSIS — M545 Low back pain: Secondary | ICD-10-CM

## 2017-01-15 NOTE — Telephone Encounter (Signed)
Requesting:   tramadol Contract   Signed on 12/16/2016 UDS   Low risk next is due on 06/18/2017 Last OV   12/16/2016----future appointment is on 06/15/2017 Last Refill    #120 no refills on 12/16/2016  Please Advise

## 2017-01-16 NOTE — Telephone Encounter (Signed)
Faxed hardcopy for Tramadol to [facility] [place] 

## 2017-01-29 ENCOUNTER — Encounter: Payer: Self-pay | Admitting: Family Medicine

## 2017-01-29 NOTE — Telephone Encounter (Signed)
We can write a note that she has celiac and she needs time off when she has an attack but my guess is they need a note each time as proof that that is what is actually the problem --- many people lie about what is wrong with them so they need note --

## 2017-02-02 ENCOUNTER — Encounter: Payer: Self-pay | Admitting: Family Medicine

## 2017-02-17 ENCOUNTER — Other Ambulatory Visit: Payer: Self-pay | Admitting: Family Medicine

## 2017-02-17 DIAGNOSIS — M545 Low back pain: Secondary | ICD-10-CM

## 2017-02-17 NOTE — Telephone Encounter (Signed)
In [place] 

## 2017-02-17 NOTE — Telephone Encounter (Signed)
Faxed hardcopy for Tramadol to [facility] 

## 2017-02-17 NOTE — Telephone Encounter (Signed)
Requesting:    tramadol Contract    12/16/2016 Last OV  12/16/2016 Last Refill   #120 on 01/15/2017 UDS    Low risk next is due on 06/18/2017  Please Advise

## 2017-03-20 ENCOUNTER — Other Ambulatory Visit: Payer: Self-pay | Admitting: Family Medicine

## 2017-03-20 DIAGNOSIS — M545 Low back pain: Secondary | ICD-10-CM

## 2017-03-20 DIAGNOSIS — I10 Essential (primary) hypertension: Secondary | ICD-10-CM

## 2017-03-20 NOTE — Telephone Encounter (Signed)
Requesting:   tramadol Contract   12/16/2016 UDS   Low risk ---- next due on 06/18/2017 Last OV    12/16/2016 Last Refill    #120 no refills on 02/17/2017  Please Advise

## 2017-03-20 NOTE — Telephone Encounter (Signed)
Faxed hardcopy for Tramadol to [facility] in [place] 

## 2017-04-20 ENCOUNTER — Other Ambulatory Visit: Payer: Self-pay | Admitting: Family Medicine

## 2017-04-20 DIAGNOSIS — M545 Low back pain: Secondary | ICD-10-CM

## 2017-04-20 NOTE — Telephone Encounter (Signed)
Faxed hardcopy for Tramadol to [facility] [place] 

## 2017-04-20 NOTE — Telephone Encounter (Signed)
Requesting:    Tramadol Contract     12/16/2016 UDS   Low risk next is due on 06/18/2017 Last OV   06/12/2016 Last Refill    #120 on  03/20/2017  Please Advise

## 2017-05-11 ENCOUNTER — Other Ambulatory Visit: Payer: Self-pay | Admitting: Family Medicine

## 2017-05-11 ENCOUNTER — Telehealth: Payer: Self-pay | Admitting: Family Medicine

## 2017-05-11 DIAGNOSIS — Z1239 Encounter for other screening for malignant neoplasm of breast: Secondary | ICD-10-CM

## 2017-05-11 DIAGNOSIS — Z8719 Personal history of other diseases of the digestive system: Secondary | ICD-10-CM

## 2017-05-11 NOTE — Telephone Encounter (Signed)
Please order Amb ref gsatro for surveillance, h/o celiac disease and MGM for screening purposes bor bresat cancer screen

## 2017-05-11 NOTE — Telephone Encounter (Signed)
Pt called to because she said that she is due a Colonoscopy and mammogram. Pt would like to have orders placed to have completed.

## 2017-05-11 NOTE — Telephone Encounter (Signed)
Last colonoscopy 06/22/2011  Dr. Marylee FlorasMann---repeat in 5 years. Last mammogram  06/18/2016

## 2017-05-11 NOTE — Telephone Encounter (Signed)
Both orders put in and patient notified.

## 2017-05-20 ENCOUNTER — Other Ambulatory Visit: Payer: Self-pay | Admitting: Family Medicine

## 2017-05-20 DIAGNOSIS — M545 Low back pain: Secondary | ICD-10-CM

## 2017-05-21 NOTE — Telephone Encounter (Signed)
[facility] [place]

## 2017-05-21 NOTE — Telephone Encounter (Signed)
Requesting:   tramadol Contract    12/16/2016 UDS   Low risk next is due on 06/18/2017 Last OV    12/16/2016 Last Refill    #120 no refils 04/20/2017  Please Advise

## 2017-06-02 DIAGNOSIS — R140 Abdominal distension (gaseous): Secondary | ICD-10-CM | POA: Diagnosis not present

## 2017-06-02 DIAGNOSIS — K900 Celiac disease: Secondary | ICD-10-CM | POA: Diagnosis not present

## 2017-06-15 ENCOUNTER — Encounter: Payer: Self-pay | Admitting: Family Medicine

## 2017-06-15 ENCOUNTER — Ambulatory Visit (INDEPENDENT_AMBULATORY_CARE_PROVIDER_SITE_OTHER): Payer: Medicare Other | Admitting: Family Medicine

## 2017-06-15 DIAGNOSIS — M545 Low back pain: Secondary | ICD-10-CM | POA: Diagnosis not present

## 2017-06-15 DIAGNOSIS — I10 Essential (primary) hypertension: Secondary | ICD-10-CM | POA: Diagnosis not present

## 2017-06-15 MED ORDER — TRAMADOL HCL 50 MG PO TABS: 50.0000 mg | ORAL_TABLET | Freq: Four times a day (QID) | ORAL | 0 refills | Status: DC | PRN

## 2017-06-15 MED ORDER — LISINOPRIL-HYDROCHLOROTHIAZIDE 20-12.5 MG PO TABS: 1.0000 | ORAL_TABLET | Freq: Every day | ORAL | 1 refills | Status: DC

## 2017-06-15 NOTE — Assessment & Plan Note (Signed)
Well controlled, no changes to meds. Encouraged heart healthy diet such as the DASH diet and exercise as tolerated.  °

## 2017-06-15 NOTE — Patient Instructions (Signed)

## 2017-06-15 NOTE — Assessment & Plan Note (Signed)
Stable con't meds 

## 2017-06-15 NOTE — Progress Notes (Signed)
Patient ID: Morgan [name], female    DOB: 16-May-1951  Age: 66 y.o. MRN: [id number]    Subjective:  Subjective  HPI  Morgan LinerDorothy Hallowell presents for f/u and refills meds No complaints.  Pt is moving to [place]  Review of Systems  Constitutional: Negative for fever.  HENT: Negative for congestion.   Respiratory: Negative for shortness of breath.   Cardiovascular: Negative for chest pain, palpitations and leg swelling.  Gastrointestinal: Negative for abdominal pain, blood in stool and nausea.  Genitourinary: Negative for dysuria and frequency.  Skin: Negative for rash.  Allergic/Immunologic: Negative for environmental allergies.  Neurological: Negative for dizziness and headaches.  Psychiatric/Behavioral: The patient is not nervous/anxious.     History Past Medical History:  Diagnosis Date  . Asthma   . Hypertension   . Low back pain     She has a past surgical history that includes Back surgery.   Her family history includes Cancer in her unknown relative; Hypertension in her unknown relative; Stroke in her unknown relative; Throat cancer in her sister.She reports that she has quit smoking. She has never used smokeless tobacco. She reports that she does not drink alcohol or use drugs.  No current outpatient prescriptions on file prior to visit.   No current facility-administered medications on file prior to visit.      Objective:  Objective  Physical Exam  Constitutional: She is oriented to person, place, and time. She appears well-developed and well-nourished.  HENT:  Head: Normocephalic and atraumatic.  Eyes: Conjunctivae and EOM are normal.  Neck: Normal range of motion. Neck supple. No JVD present. Carotid bruit is not present. No thyromegaly present.  Cardiovascular: Normal rate, regular rhythm and normal heart sounds.   No murmur heard. Pulmonary/Chest: Effort normal and breath sounds normal. No respiratory distress. She has no wheezes. She has no rales. She  exhibits no tenderness.  Musculoskeletal: She exhibits no edema.  Neurological: She is alert and oriented to person, place, and time.  Psychiatric: She has a normal mood and affect.  Nursing note and vitals reviewed.  BP 108/60 (BP Location: Right Arm, Patient Position: Sitting, Cuff Size: Normal)   Pulse 65   Temp 98 F (36.7 C) (Oral)   Wt 174 lb (78.9 kg)   SpO2 98%   BMI 28.96 kg/m  Wt Readings from Last 3 Encounters:  06/15/17 174 lb (78.9 kg)  12/16/16 176 lb (79.8 kg)  06/13/16 177 lb 6.4 oz (80.5 kg)     Lab Results  Component Value Date   WBC 5.5 02/29/2016   HGB 11.3 (L) 02/29/2016   HCT 34.1 (L) 02/29/2016   PLT 208 02/29/2016   GLUCOSE 88 02/29/2016   CHOL 168 02/29/2016   TRIG 87 02/29/2016   HDL 64 02/29/2016   LDLCALC 87 02/29/2016   ALT 26 02/29/2016   AST 23 02/29/2016   NA 143 02/29/2016   K 3.8 02/29/2016   CL 106 02/29/2016   CREATININE 0.90 02/29/2016   BUN 11 02/29/2016   CO2 29 02/29/2016   TSH 5.07 06/04/2012    Mm Diag Breast Tomo Uni Right  Result Date: 12/21/2014 CLINICAL DATA:  Call back from screening for a possible right breast mass. EXAM: DIGITAL DIAGNOSTIC RIGHT MAMMOGRAM WITH 3D TOMOSYNTHESIS AND CAD COMPARISON:  Prior exams ACR Breast Density Category b: There are scattered areas of fibroglandular density. FINDINGS: The possible mass seen on screening study is not evident on the follow-up 2D or 3D images. There is no evidence of  a mass and no architectural distortion. There are no suspicious calcifications. Mammographic images were processed with CAD. IMPRESSION: Normal exam.  No evidence of malignancy. RECOMMENDATION: Screening mammogram in one year.(Code:SM-B-01Y) I have discussed the findings and recommendations with the patient. Results were also provided in writing at the conclusion of the visit. If applicable, a reminder letter will be sent to the patient regarding the next appointment. BI-RADS CATEGORY  1: Negative. Electronically  Signed   By: Amie Portland M.D.   On: 12/21/2014 10:21     Assessment & Plan:  Plan  I have discontinued Ms. Kress's lisinopril. I have also changed her traMADol. Additionally, I am having her maintain her Calcium and lisinopril-hydrochlorothiazide.  Meds ordered this encounter  Medications  . Calcium 500-100 MG-UNIT CHEW    Sig: Calcium 500  . DISCONTD: lisinopril (PRINIVIL,ZESTRIL) 10 MG tablet    Sig: lisinopril 10 mg tablet  Take 1 tablet every day by oral route.  . traMADol (ULTRAM) 50 MG tablet    Sig: Take 1-2 tablets (50-100 mg total) by mouth every 6 (six) hours as needed.    Dispense:  120 tablet    Refill:  0  . lisinopril-hydrochlorothiazide (PRINZIDE,ZESTORETIC) 20-12.5 MG tablet    Sig: Take 1 tablet by mouth daily.    Dispense:  90 tablet    Refill:  1    Problem List Items Addressed This Visit      Unprioritized   Essential hypertension    Well controlled, no changes to meds. Encouraged heart healthy diet such as the DASH diet and exercise as tolerated.       Relevant Medications   lisinopril-hydrochlorothiazide (PRINZIDE,ZESTORETIC) 20-12.5 MG tablet   LOW BACK PAIN, CHRONIC    Stable con't meds      Relevant Medications   traMADol (ULTRAM) 50 MG tablet      Follow-up: Return in about 6 months (around 12/13/2017), or if symptoms worsen or fail to improve.  Donato Schultz, DO

## 2017-06-23 ENCOUNTER — Ambulatory Visit
Admission: RE | Admit: 2017-06-23 | Discharge: 2017-06-23 | Disposition: A | Discharge status: Home / Self Care | Payer: Medicare Other | Source: Ambulatory Visit | Attending: Family Medicine | Admitting: Family Medicine

## 2017-06-23 DIAGNOSIS — Z1239 Encounter for other screening for malignant neoplasm of breast: Secondary | ICD-10-CM

## 2017-06-23 DIAGNOSIS — Z1231 Encounter for screening mammogram for malignant neoplasm of breast: Secondary | ICD-10-CM | POA: Diagnosis not present

## 2017-06-29 ENCOUNTER — Ambulatory Visit: Payer: Medicare Other

## 2017-07-20 ENCOUNTER — Other Ambulatory Visit: Payer: Self-pay | Admitting: Family Medicine

## 2017-07-20 DIAGNOSIS — M545 Low back pain: Secondary | ICD-10-CM

## 2017-07-21 NOTE — Telephone Encounter (Signed)
Requesting:   Tramadol Contract    12/16/2016 UDS   Low risk next is/was due on 06/18/2017 Last OV     06/15/2017 Last Refill    #120 no refills on 06/15/2017  Please Advise

## 2017-07-22 ENCOUNTER — Other Ambulatory Visit: Payer: Self-pay | Admitting: Family Medicine

## 2017-07-22 DIAGNOSIS — M545 Low back pain: Secondary | ICD-10-CM

## 2017-07-22 MED ORDER — TRAMADOL HCL 50 MG PO TABS: 50.0000 mg | ORAL_TABLET | Freq: Four times a day (QID) | ORAL | 0 refills | Status: DC | PRN

## 2017-07-22 NOTE — Addendum Note (Signed)
Addended by: Scharlene GlossEWING, Valerya Maxton B on: 07/22/2017 08:17 AM   Modules accepted: Orders

## 2017-07-22 NOTE — Telephone Encounter (Signed)
Pt called, stated that pharmacy had told her prescription has been denied. So I let her know that the note in chart said it would be sent to pharmacy on 07/23/17.

## 2017-07-22 NOTE — Telephone Encounter (Signed)
PCP approved.signed and will fax to pharmacy on 07/23/17

## 2017-08-04 DIAGNOSIS — Z23 Encounter for immunization: Secondary | ICD-10-CM | POA: Diagnosis not present

## 2017-08-21 ENCOUNTER — Telehealth: Payer: Self-pay | Admitting: Family Medicine

## 2017-08-21 DIAGNOSIS — M545 Low back pain: Secondary | ICD-10-CM

## 2017-08-21 MED ORDER — TRAMADOL HCL 50 MG PO TABS: 50.0000 mg | ORAL_TABLET | Freq: Four times a day (QID) | ORAL | 1 refills | Status: DC | PRN

## 2017-08-21 NOTE — Telephone Encounter (Signed)
Last OV was 06/15/17 last filled on 07/22/17 #120 tablets with 0 refill

## 2017-08-21 NOTE — Telephone Encounter (Signed)
Patient called and stts she needs to get a refill on her Tramadol, Sent to [address]

## 2017-08-24 NOTE — Telephone Encounter (Signed)
Called rx into pharmacy and left message on pts machine.

## 2017-08-24 NOTE — Telephone Encounter (Addendum)
°  Relation to WJ:XBJYpt:self Call back [phone] Pharmacy: [id number] - [place], [place] - [address] [facility] [address]  Reason for call:  Patient states she spoke with pharmacy today and as per pharmacist never received the "phone in" regarding her traMADol (ULTRAM) 50 MG tablet, patient states she wasn't able to go to work today due to her not having her medication, please advise

## 2017-09-03 ENCOUNTER — Encounter: Payer: Self-pay | Admitting: Family Medicine

## 2017-09-08 ENCOUNTER — Encounter: Payer: Self-pay | Admitting: Internal Medicine

## 2017-09-08 ENCOUNTER — Ambulatory Visit (INDEPENDENT_AMBULATORY_CARE_PROVIDER_SITE_OTHER): Payer: Medicare Other | Admitting: Internal Medicine

## 2017-09-08 VITALS — BP 124/60 | HR 88 | Temp 98.4°F | Resp 14 | Ht 65.0 in | Wt 172.4 lb

## 2017-09-08 DIAGNOSIS — M545 Low back pain: Secondary | ICD-10-CM | POA: Diagnosis not present

## 2017-09-08 MED ORDER — PREDNISONE 10 MG PO TABS: ORAL_TABLET | ORAL | 0 refills | Status: DC

## 2017-09-08 MED ORDER — CYCLOBENZAPRINE HCL 10 MG PO TABS: 10.0000 mg | ORAL_TABLET | Freq: Every evening | ORAL | 0 refills | Status: DC | PRN

## 2017-09-08 NOTE — Patient Instructions (Signed)
Rest  Take prednisone as prescribed  For pain take tramadol as needed, you can also use Tylenol.  Use Flexeril at night, this is a muscle relaxant  Okay to use a heating pad or take a bath in warm water  Call if not gradually better, call if symptoms severe

## 2017-09-08 NOTE — Progress Notes (Signed)
Subjective:    Patient ID: Morgan [name], female    DOB: 1951/01/22, 66 y.o.   MRN: [id number]  DOS:  09/08/2017 Type of visit - description : Acute visit, back pain Interval history: The patient has chronic left-sided back pain with occasionally left leg cramps for many years. Symptoms increased for the last week, pain is worse when she stands up, bends.  Decrease when she lays down. She got relief by laying down on a warm bathtub. Her whole left leg cramps have increased  lately. No fall but she has been more active than usual doing a lot of walking.   Review of Systems Denies fever chills No rash No bladder or bowel incontinence  Past Medical History:  Diagnosis Date  . Asthma   . Hypertension   . Low back pain     Past Surgical History:  Procedure Laterality Date  . BACK SURGERY     Low Back    Social History   Socioeconomic History  . Marital status: Married    Spouse name: Not on file  . Number of children: Not on file  . Years of education: Not on file  . Highest education level: Not on file  Social Needs  . Financial resource strain: Not on file  . Food insecurity - worry: Not on file  . Food insecurity - inability: Not on file  . Transportation needs - medical: Not on file  . Transportation needs - non-medical: Not on file  Occupational History  . Occupation: sub     [employer]  Tobacco Use  . Smoking status: Former Games developermoker  . Smokeless tobacco: Never Used  Substance and Sexual Activity  . Alcohol use: No  . Drug use: No  . Sexual activity: No  Other Topics Concern  . Not on file  Social History Narrative  . Not on file      Allergies as of 09/08/2017      Reactions   Alupent [metaproterenol] Shortness Of Breath   Asthma Flare-up   Hydrocodone-acetaminophen Itching      Medication List        Accurate as of 09/08/17  2:35 PM. Always use your most recent med list.          Calcium 500-100 MG-UNIT Chew Calcium  500   lisinopril-hydrochlorothiazide 20-12.5 MG tablet Commonly known as:  PRINZIDE,ZESTORETIC Take 1 tablet by mouth daily.   traMADol 50 MG tablet Commonly known as:  ULTRAM Take 1-2 tablets (50-100 mg total) every 6 (six) hours as needed by mouth.          Objective:   Physical Exam BP 124/60 (BP Location: Left Arm, Patient Position: Sitting, Cuff Size: Small)   Pulse 88   Temp 98.4 F (36.9 C) (Oral)   Resp 14   Ht 5\' 5"  (1.651 m)   Wt 172 lb 6 oz (78.2 kg)   SpO2 98%   BMI 28.68 kg/m  General:   Well developed, well nourished . NAD.  HEENT:  Normocephalic . Face symmetric, atraumatic Abdomen:  Not distended, soft, non-tender. No rebound or rigidity.   Skin: Not pale. Not jaundice.  No rash at the back MSK: Slightly TTP on the lower back mostly at the left SI area Neurologic:  alert & oriented X3.  Speech normal, gait appropriate for age and unassisted.  Posture is a slightly antalgic.  DTRs and motor symmetric.  Straight leg test negative Psych--  Cognition and judgment appear intact.  Cooperative with normal attention span and concentration.  Behavior appropriate. No anxious or depressed appearing.     Assessment & Plan:  66 year old female with several medical problems including HTN, asthma, chronic back pain presents with: Acute on chronic back pain:  chronic pain increased for 1 week, no injury but admits to increased physical activity.  Neuro exam normal. Continue tramadol, addd prednisone for a few days, also muscle relaxant.  Call if not improving.  See AVS

## 2017-09-08 NOTE — Progress Notes (Signed)
Pre visit review using our clinic review tool, if applicable. No additional management support is needed unless otherwise documented below in the visit note. 

## 2017-10-23 ENCOUNTER — Encounter: Payer: Self-pay | Admitting: Family Medicine

## 2017-10-23 ENCOUNTER — Telehealth: Payer: Self-pay | Admitting: Family Medicine

## 2017-10-23 NOTE — Telephone Encounter (Signed)
Patient requesting refill on traMADol (ULTRAM) 50 MG tablet, last OV 09/08/17.

## 2017-10-23 NOTE — Telephone Encounter (Signed)
Copied from CRM [id number]. Topic: Quick Communication - Rx Refill/Question >> Oct 23, 2017 11:25 AM Floria RavelingStovall, Shana A wrote: Medication:    Has the patient contacted their pharmacy? no   (Agent: If no, request that the patient contact the pharmacy for the refill.)   Preferred Pharmacy (with phone number or street name): [address], [place], [place] [id number]  pt is out of town and put the wrong pharmacy on her message that she sent  Best number [phone]   Agent: Please be advised that RX refills may take up to 3 business days. We ask that you follow-up with your pharmacy.

## 2017-10-26 ENCOUNTER — Telehealth: Payer: Self-pay | Admitting: *Deleted

## 2017-10-26 NOTE — Telephone Encounter (Signed)
See mychart message as well    Copied from CRM [phone]. Topic: Quick Communication - Rx Refill/Question >> Oct 23, 2017 11:25 AM Floria RavelingStovall, Shana A wrote: Medication: traMADol (ULTRAM) 50 MG tablet [[id number]][[id number]]    Has the patient contacted their pharmacy? no   (Agent: If no, request that the patient contact the pharmacy for the refill.)   Preferred Pharmacy (with phone number or street name): [address], [place], [place] [id number]  pt is out of town and put the wrong pharmacy on her message that she sent  Best number [phone]   Agent: Please be advised that RX refills may take up to 3 business days. We ask that you follow-up with your pharmacy. >> Oct 26, 2017 10:57 AM Alexander BergeronBarksdale, Harvey B wrote: Pt called to check the status of Rx request, contact pt if needed or pharmacy that was given in [place]

## 2017-10-27 ENCOUNTER — Telehealth: Payer: Self-pay | Admitting: Family Medicine

## 2017-10-27 DIAGNOSIS — M545 Low back pain: Secondary | ICD-10-CM | POA: Diagnosis not present

## 2017-10-27 NOTE — Telephone Encounter (Signed)
Spoke with the patient she will need to have to see someone in [place]

## 2017-10-27 NOTE — Telephone Encounter (Signed)
Copied from CRM [phone]. Topic: General - Other >> Oct 27, 2017 12:37 PM Stephannie LiSimmons, Jaquez Farrington L, NT wrote: Reason for CRM: Patient called to check on status of refill,[facility] [place], on [address] ,pharmacy # [phone]

## 2017-10-27 NOTE — Telephone Encounter (Signed)
Spoke with patient and let her know we can not fill medication out of state

## 2017-11-03 ENCOUNTER — Telehealth: Payer: Self-pay | Admitting: Family Medicine

## 2017-11-03 NOTE — Telephone Encounter (Signed)
Ultram refill request   Controlled substance Last OV 09/08/17

## 2017-11-03 NOTE — Telephone Encounter (Signed)
Copied from CRM [id number]. Topic: Quick Communication - Rx Refill/Question >> Nov 03, 2017  3:10 PM Viviann SpareWhite, Selina wrote: Medication:  traMADol (ULTRAM) 50 MG tablet    Has the patient contacted their pharmacy? Yes.     (Agent: If no, request that the patient contact the pharmacy for the refill.)   Preferred Pharmacy (with phone number or street name):  [id number] - [place], [place] - [address] [facility] [address] [place] [id number] Phone: [phone] Fax: [phone]     Agent: Please be advised that RX refills may take up to 3 business days. We ask that you follow-up with your pharmacy.

## 2017-11-04 ENCOUNTER — Other Ambulatory Visit: Payer: Self-pay | Admitting: Family Medicine

## 2017-11-04 DIAGNOSIS — M545 Low back pain: Secondary | ICD-10-CM

## 2017-11-04 MED ORDER — TRAMADOL HCL 50 MG PO TABS: 50.0000 mg | ORAL_TABLET | Freq: Four times a day (QID) | ORAL | 1 refills | Status: DC | PRN

## 2017-11-04 NOTE — Telephone Encounter (Signed)
done

## 2017-11-04 NOTE — Telephone Encounter (Signed)
Patient requesting refill for tramadol  Database ran and is on your desk for review.  Last filled per database:  09/22/18 Last written: 08/21/17 Last ov: 06/23/17 Next ov: none Contract: 12/16/16 UDS: due

## 2017-11-04 NOTE — Telephone Encounter (Signed)
Left message on machine that rx have been sent in.

## 2017-12-03 ENCOUNTER — Encounter: Payer: Self-pay | Admitting: Medical

## 2017-12-03 ENCOUNTER — Ambulatory Visit: Payer: Medicare Other | Admitting: Family Medicine

## 2017-12-03 ENCOUNTER — Ambulatory Visit (INDEPENDENT_AMBULATORY_CARE_PROVIDER_SITE_OTHER): Payer: Medicare Other | Admitting: Medical

## 2017-12-03 VITALS — BP 118/56 | HR 67 | Temp 98.2°F | Resp 16 | Ht 65.0 in | Wt 169.8 lb

## 2017-12-03 DIAGNOSIS — Z79899 Other long term (current) drug therapy: Secondary | ICD-10-CM | POA: Diagnosis not present

## 2017-12-03 DIAGNOSIS — M545 Low back pain: Secondary | ICD-10-CM

## 2017-12-03 MED ORDER — TRAMADOL HCL 50 MG PO TABS: 50.0000 mg | ORAL_TABLET | Freq: Four times a day (QID) | ORAL | 0 refills | Status: DC | PRN

## 2017-12-03 NOTE — Progress Notes (Signed)
[id number]

## 2017-12-03 NOTE — Progress Notes (Signed)
Subjective:    Patient ID: Morgan [name], female    DOB: 15-Nov-1950, 67 y.o.   MRN: [id number]  HPI  Pt in for refill of tramadol.  Leaving town for [employer] and  In 2 days  will run out of medication..  She told the medical assistant that last time she tried to get a prescription to transfer to [place] that was written in [place] they would not fill it and she ended up having to go to urgent care.   Patient's pain has been present since 1994.  Had back surgery.  Recently she states that the pain is controlled with tramadol.  No acute flare of pain presently.   Review of Systems  Constitutional: Negative for chills, fatigue and fever.  Respiratory: Negative for cough, chest tightness, shortness of breath and wheezing.   Cardiovascular: Negative for chest pain and palpitations.  Musculoskeletal: Positive for back pain.  Skin: Negative for rash.  Neurological: Negative for dizziness, weakness, numbness and headaches.  Hematological: Negative for adenopathy. Does not bruise/bleed easily.  Psychiatric/Behavioral: Negative for behavioral problems, confusion and suicidal ideas. The patient is not nervous/anxious.     Past Medical History:  Diagnosis Date  . Asthma   . Hypertension   . Low back pain      Social History   Socioeconomic History  . Marital status: Married    Spouse name: Not on file  . Number of children: Not on file  . Years of education: Not on file  . Highest education level: Not on file  Social Needs  . Financial resource strain: Not on file  . Food insecurity - worry: Not on file  . Food insecurity - inability: Not on file  . Transportation needs - medical: Not on file  . Transportation needs - non-medical: Not on file  Occupational History  . Occupation: sub     Employer: [name]  Tobacco Use  . Smoking status: Former Games developer  . Smokeless tobacco: Never Used  Substance and Sexual Activity  . Alcohol use: No  . Drug  use: No  . Sexual activity: No  Other Topics Concern  . Not on file  Social History Narrative  . Not on file    Past Surgical History:  Procedure Laterality Date  . BACK SURGERY     Low Back    Family History  Problem Relation Age of Onset  . Cancer Unknown   . Throat cancer Sister   . Stroke Unknown   . Hypertension Unknown     Allergies  Allergen Reactions  . Alupent [Metaproterenol] Shortness Of Breath    Asthma Flare-up  . Hydrocodone-Acetaminophen Itching    Current Outpatient Medications on File Prior to Visit  Medication Sig Dispense Refill  . Calcium 500-100 MG-UNIT CHEW Calcium 500    . cyclobenzaprine (FLEXERIL) 10 MG tablet Take 1 tablet (10 mg total) by mouth at bedtime as needed for muscle spasms. 21 tablet 0  . lisinopril-hydrochlorothiazide (PRINZIDE,ZESTORETIC) 20-12.5 MG tablet Take 1 tablet by mouth daily. 90 tablet 1  . predniSONE (DELTASONE) 10 MG tablet 4 tablets x 2 days, 3 tabs x 2 days, 2 tabs x 2 days, 1 tab x 2 days 20 tablet 0   No current facility-administered medications on file prior to visit.     BP (!) 118/56   Pulse 67   Temp 98.2 F (36.8 C) (Oral)   Resp 16   Ht 5\' 5"  (1.651 m)   Wt  169 lb 12.8 oz (77 kg)   SpO2 100%   BMI 28.26 kg/m       Objective:   Physical Exam  General- No acute distress. Pleasant patient. Neck- Full range of motion, no jvd Lungs- Clear, even and unlabored. Heart- regular rate and rhythm. Neurologic- CNII- XII grossly intact. Back-mild mid lumbar tenderness to palpation presently.     Assessment & Plan:  I did refill you tramadol as print rx. [facility] since you will fill out of town.  Please do UDS today.  Follow up as regularly scheduled with pcp or as needed  I did talk with pharmacy directly today and asked him to cancel the refill she has locally for tramadol.  Medical assistant did this earlier as well.  I checked patient's narcs report and she has had no  other prescriptions of tramadol.

## 2017-12-03 NOTE — Patient Instructions (Signed)
I did refill you tramadol as print rx. [facility] since you will fill out of town.  Please do UDS today.  Follow up as regularly scheduled with pcp or as needed

## 2017-12-05 LAB — PAIN MGMT, PROFILE 8 W/CONF, U
6 Acetylmorphine: NEGATIVE ng/mL (ref <10)
Alcohol Metabolites: NEGATIVE ng/mL (ref <500)
Amphetamines: NEGATIVE ng/mL (ref <500)
BENZODIAZEPINES: NEGATIVE ng/mL (ref <100)
BUPRENORPHINE, URINE: NEGATIVE ng/mL (ref <5)
CREATININE: 150 mg/dL
Cocaine Metabolite: NEGATIVE ng/mL (ref <150)
MDMA: NEGATIVE ng/mL (ref <500)
Marijuana Metabolite: NEGATIVE ng/mL (ref <20)
OXYCODONE: NEGATIVE ng/mL (ref <100)
Opiates: NEGATIVE ng/mL (ref <100)
Oxidant: NEGATIVE ug/mL (ref <200)
pH: 7.07 (ref 4.5–9.0)

## 2017-12-05 LAB — PAIN MGMT, TRAMADOL W/MEDMATCH, U
Desmethyltramadol: 10272 ng/mL — ABNORMAL HIGH (ref <100)
TRAMADOL: 30063 ng/mL — AB (ref <100)

## 2018-01-06 ENCOUNTER — Other Ambulatory Visit: Payer: Self-pay | Admitting: Family Medicine

## 2018-01-06 DIAGNOSIS — I10 Essential (primary) hypertension: Secondary | ICD-10-CM

## 2018-01-11 ENCOUNTER — Telehealth: Payer: Self-pay | Admitting: Family Medicine

## 2018-01-11 NOTE — Telephone Encounter (Signed)
Copied from CRM [phone]. Topic: Quick Communication - See Telephone Encounter >> Jan 11, 2018 11:11 AM Rudi CocoLathan, Alberta Lenhard M, NT wrote: CRM for notification. See Telephone encounter for: 01/11/18.  Pt. Calling to ask why med refill for traMADol (ULTRAM) 50 MG tablet [[id number]][[id number]] was denied pt. Received b/p med. But not the tramadol would like for someone to give her a call about this matter    [phone] [place] - [address] [facility] [address] [place] [id number] Phone: [phone] Fax: [phone]

## 2018-01-12 ENCOUNTER — Other Ambulatory Visit: Payer: Self-pay

## 2018-01-12 ENCOUNTER — Other Ambulatory Visit: Payer: Self-pay | Admitting: Family Medicine

## 2018-01-12 DIAGNOSIS — M545 Low back pain: Secondary | ICD-10-CM

## 2018-01-12 MED ORDER — TRAMADOL HCL 50 MG PO TABS: 50.0000 mg | ORAL_TABLET | Freq: Four times a day (QID) | ORAL | 0 refills | Status: DC | PRN

## 2018-01-12 NOTE — Telephone Encounter (Signed)
Requesting: ULTRAM  50 MG Contract:  12/15/16 UDS:12/03/17 negative Last OV: 12/03/17 Next OV: Not scheduled Last Refill: 12/03/17     Please advise

## 2018-01-12 NOTE — Telephone Encounter (Signed)
Pt called to check status of medication, call pt if needed

## 2018-01-12 NOTE — Telephone Encounter (Signed)
Spoke with pt notified her request was sent to provider

## 2018-01-12 NOTE — Telephone Encounter (Signed)
Patient notified that rx was sent to pharmacy  ?

## 2018-01-15 ENCOUNTER — Telehealth: Payer: Self-pay | Admitting: *Deleted

## 2018-01-15 NOTE — Telephone Encounter (Signed)
Patient stated that she called about the request on 01/07/18.  I do not see where the rx was denied.  Dr. Laury AxonLowne will not ok the letter.    Can you please call patient?  She states that it our fault because we did not send in rx until 01/12/18.

## 2018-01-15 NOTE — Telephone Encounter (Signed)
Copied from CRM [id number]. 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 Pt states she needs a letter from the doctor to excuse her for missing the meeting. Pt would like you to put the letter on mychart so she can print it out. Pt ha

## 2018-01-16 NOTE — Telephone Encounter (Signed)
It looks like the med was filled within 24 hrs---  Am I wrong?

## 2018-01-20 ENCOUNTER — Encounter: Payer: Self-pay | Admitting: Family Medicine

## 2018-01-20 NOTE — Telephone Encounter (Signed)
Left message for patient to return my call.   Appears we only got a request from her pharmacy for BP meds which is what caused the delay. Once we were aware of her refill request the medication was called in within 24 hours.

## 2018-02-12 ENCOUNTER — Ambulatory Visit (INDEPENDENT_AMBULATORY_CARE_PROVIDER_SITE_OTHER): Payer: Medicare Other | Admitting: Family Medicine

## 2018-02-12 ENCOUNTER — Encounter: Payer: Self-pay | Admitting: Family Medicine

## 2018-02-12 VITALS — BP 140/72 | HR 80 | Temp 98.1°F | Resp 16 | Ht 65.0 in | Wt 165.0 lb

## 2018-02-12 DIAGNOSIS — M545 Low back pain: Secondary | ICD-10-CM | POA: Diagnosis not present

## 2018-02-12 DIAGNOSIS — I10 Essential (primary) hypertension: Secondary | ICD-10-CM

## 2018-02-12 DIAGNOSIS — Z23 Encounter for immunization: Secondary | ICD-10-CM

## 2018-02-12 DIAGNOSIS — R42 Dizziness and giddiness: Secondary | ICD-10-CM | POA: Diagnosis not present

## 2018-02-12 LAB — COMPREHENSIVE METABOLIC PANEL
AG Ratio: 1.9 (calc) (ref 1.0–2.5)
ALBUMIN MSPROF: 4.1 g/dL (ref 3.6–5.1)
ALT: 17 U/L (ref 6–29)
AST: 15 U/L (ref 10–35)
Alkaline phosphatase (APISO): 64 U/L (ref 33–130)
BUN: 13 mg/dL (ref 7–25)
CO2: 31 mmol/L (ref 20–32)
CREATININE: 0.84 mg/dL (ref 0.50–0.99)
Calcium: 9.2 mg/dL (ref 8.6–10.4)
Chloride: 105 mmol/L (ref 98–110)
GLOBULIN: 2.2 g/dL (ref 1.9–3.7)
GLUCOSE: 87 mg/dL (ref 65–99)
POTASSIUM: 4.5 mmol/L (ref 3.5–5.3)
Sodium: 143 mmol/L (ref 135–146)
Total Bilirubin: 0.5 mg/dL (ref 0.2–1.2)
Total Protein: 6.3 g/dL (ref 6.1–8.1)

## 2018-02-12 LAB — LIPID PANEL
CHOLESTEROL: 162 mg/dL (ref <200)
HDL: 55 mg/dL (ref >50)
LDL Cholesterol (Calc): 92 mg/dL (calc)
NON-HDL CHOLESTEROL (CALC): 107 mg/dL (ref <130)
Total CHOL/HDL Ratio: 2.9 (calc) (ref <5.0)
Triglycerides: 67 mg/dL (ref <150)

## 2018-02-12 MED ORDER — TRAMADOL HCL 50 MG PO TABS: ORAL_TABLET | ORAL | 0 refills | Status: DC

## 2018-02-12 MED ORDER — TRAMADOL HCL 50 MG PO TABS: 50.0000 mg | ORAL_TABLET | Freq: Four times a day (QID) | ORAL | 0 refills | Status: DC | PRN

## 2018-02-12 NOTE — Assessment & Plan Note (Signed)
Stable con't meds 

## 2018-02-12 NOTE — Progress Notes (Signed)
Patient ID: Morgan [name], female   DOB: [birth year], 67 y.o.   MRN: [id number]     Subjective:  I acted as a Neurosurgeon for Dr. Zola Button.  Apolonio Schneiders, CMA   Patient ID: Morgan [name], female    DOB: 17-Oct-1950, 67 y.o.   MRN: [id number]  Chief Complaint  Patient presents with  . med follow up  . Dizziness    Dizziness  This is a new problem. Episode onset: noticed last week. The problem has been gradually improving. Associated symptoms include fatigue, nausea (last week), urinary symptoms (hard to start to urinate) and vertigo. Pertinent negatives include no abdominal pain, chest pain, chills, congestion, coughing, diaphoresis, headaches, joint swelling, myalgias, neck pain, numbness, rash, sore throat, swollen glands, visual change, vomiting or weakness. The symptoms are aggravated by bending. She has tried nothing for the symptoms.    Patient is in today for medication follow up and dizziness.  She is moving to [place] and wants to make sure she is up to date on labs and medication until she finds a provider there.  She is also having so dizziness.  Patient Care Team: Zola Button, Grayling Congress, DO as PCP - General   Past Medical History:  Diagnosis Date  . Asthma   . Hypertension   . Low back pain     Past Surgical History:  Procedure Laterality Date  . BACK SURGERY     Low Back    Family History  Problem Relation Age of Onset  . Cancer Unknown   . Throat cancer Sister   . Stroke Unknown   . Hypertension Unknown     Social History   Socioeconomic History  . Marital status: Married    Spouse name: Not on file  . Number of children: Not on file  . Years of education: Not on file  . Highest education level: Not on file  Occupational History  . Occupation: sub     Employer: [employer]  Social Needs  . Financial resource strain: Not on file  . Food insecurity:    Worry: Not on file    Inability: Not on file  . Transportation needs:    Medical: Not on  file    Non-medical: Not on file  Tobacco Use  . Smoking status: Former Games developer  . Smokeless tobacco: Never Used  Substance and Sexual Activity  . Alcohol use: No  . Drug use: No  . Sexual activity: Never  Lifestyle  . Physical activity:    Days per week: Not on file    Minutes per session: Not on file  . Stress: Not on file  Relationships  . Social connections:    Talks on phone: Not on file    Gets together: Not on file    Attends religious service: Not on file    Active member of club or organization: Not on file    Attends meetings of clubs or organizations: Not on file    Relationship status: Not on file  . Intimate partner violence:    Fear of current or ex partner: Not on file    Emotionally abused: Not on file    Physically abused: Not on file    Forced sexual activity: Not on file  Other Topics Concern  . Not on file  Social History Narrative  . Not on file    Outpatient Medications Prior to Visit  Medication Sig Dispense Refill  . Calcium 500-100 MG-UNIT CHEW Calcium 500    .  cyclobenzaprine (FLEXERIL) 10 MG tablet Take 1 tablet (10 mg total) by mouth at bedtime as needed for muscle spasms. 21 tablet 0  . lisinopril-hydrochlorothiazide (PRINZIDE,ZESTORETIC) 20-12.5 MG tablet TAKE 1 TABLET BY MOUTH DAILY 90 tablet 0  . traMADol (ULTRAM) 50 MG tablet Take 1-2 tablets (50-100 mg total) by mouth every 6 (six) hours as needed. 120 tablet 0  . predniSONE (DELTASONE) 10 MG tablet 4 tablets x 2 days, 3 tabs x 2 days, 2 tabs x 2 days, 1 tab x 2 days 20 tablet 0   No facility-administered medications prior to visit.     Allergies  Allergen Reactions  . Alupent [Metaproterenol] Shortness Of Breath    Asthma Flare-up  . Hydrocodone-Acetaminophen Itching    Review of Systems  Constitutional: Positive for fatigue. Negative for chills and diaphoresis.  HENT: Negative for congestion and sore throat.   Respiratory: Negative for cough.   Cardiovascular: Negative for  chest pain.  Gastrointestinal: Positive for nausea (last week). Negative for abdominal pain and vomiting.  Musculoskeletal: Negative for joint swelling, myalgias and neck pain.  Skin: Negative for rash.  Neurological: Positive for dizziness and vertigo. Negative for weakness, numbness and headaches.       Objective:    Physical Exam  Constitutional: She is oriented to person, place, and time. She appears well-developed and well-nourished.  HENT:  Head: Normocephalic and atraumatic.  Eyes: Conjunctivae and EOM are normal.  Neck: Normal range of motion. Neck supple. No JVD present. Carotid bruit is not present. No thyromegaly present.  Cardiovascular: Normal rate, regular rhythm and normal heart sounds.  No murmur heard. Pulmonary/Chest: Effort normal and breath sounds normal. No respiratory distress. She has no wheezes. She has no rales. She exhibits no tenderness.  Musculoskeletal: She exhibits no edema.  Neurological: She is alert and oriented to person, place, and time.  Psychiatric: She has a normal mood and affect.  Nursing note and vitals reviewed.   BP 140/72   Pulse 80   Temp 98.1 F (36.7 C) (Oral)   Resp 16   Ht  (1.651 m)   Wt 165 lb (74.8 kg)   SpO2 98%   BMI 27.46 kg/m  Wt Readings from Last 3 Encounters:  02/12/18 165 lb (74.8 kg)  12/03/17 169 lb 12.8 oz (77 kg)  09/08/17 172 lb 6 oz (78.2 kg)   BP Readings from Last 3 Encounters:  02/12/18 140/72  12/03/17 (!) 118/56  09/08/17 124/60     Immunization History  Administered Date(s) Administered  . Influenza Whole 09/01/2007, 09/17/2009  . Influenza-Unspecified 11/06/2016, 08/04/2017  . PPD Test 05/14/2012  . Pneumococcal Conjugate-13 12/16/2016  . Pneumococcal Polysaccharide-23 02/12/2018  . Tdap 05/14/2012  . Zoster 10/20/2013    Health Maintenance  Topic Date Due  . Hepatitis C Screening  12/22/1950  . PNA vac Low Risk Adult (2 of 2 - PPSV23) 12/16/2017  . INFLUENZA VACCINE  05/06/2018    . MAMMOGRAM  06/24/2019  . COLONOSCOPY  07/06/2021  . TETANUS/TDAP  05/14/2022  . DEXA SCAN  Completed    Lab Results  Component Value Date   WBC 5.5 02/29/2016   HGB 11.3 (L) 02/29/2016   HCT 34.1 (L) 02/29/2016   PLT 208 02/29/2016   GLUCOSE 88 02/29/2016   CHOL 168 02/29/2016   TRIG 87 02/29/2016   HDL 64 02/29/2016   LDLCALC 87 02/29/2016   ALT 26 02/29/2016   AST 23 02/29/2016   NA 143 02/29/2016   K 3.8  02/29/2016   CL 106 02/29/2016   CREATININE 0.90 02/29/2016   BUN 11 02/29/2016   CO2 29 02/29/2016   TSH 5.07 06/04/2012    Lab Results  Component Value Date   TSH 5.07 06/04/2012   Lab Results  Component Value Date   WBC 5.5 02/29/2016   HGB 11.3 (L) 02/29/2016   HCT 34.1 (L) 02/29/2016   MCV 81.6 02/29/2016   PLT 208 02/29/2016   Lab Results  Component Value Date   NA 143 02/29/2016   K 3.8 02/29/2016   CO2 29 02/29/2016   GLUCOSE 88 02/29/2016   BUN 11 02/29/2016   CREATININE 0.90 02/29/2016   BILITOT 0.5 02/29/2016   ALKPHOS 64 02/29/2016   AST 23 02/29/2016   ALT 26 02/29/2016   PROT 6.2 02/29/2016   ALBUMIN 4.0 02/29/2016   CALCIUM 9.1 02/29/2016   GFR 85.81 06/04/2012   Lab Results  Component Value Date   CHOL 168 02/29/2016   Lab Results  Component Value Date   HDL 64 02/29/2016   Lab Results  Component Value Date   LDLCALC 87 02/29/2016   Lab Results  Component Value Date   TRIG 87 02/29/2016   Lab Results  Component Value Date   CHOLHDL 2.6 02/29/2016   No results found for: HGBA1C       Assessment & Plan:   Problem List Items Addressed This Visit      Unprioritized   Essential hypertension    Well controlled, no changes to meds. Encouraged heart healthy diet such as the DASH diet and exercise as tolerated.        Relevant Orders   Lipid Profile   Comprehensive metabolic panel   LOW BACK PAIN, CHRONIC    Stable con't meds      Relevant Medications   traMADol (ULTRAM) 50 MG tablet   traMADol (ULTRAM)  50 MG tablet   traMADol (ULTRAM) 50 MG tablet    Other Visit Diagnoses    Dizziness    -  Primary   Relevant Orders   EKG 12-Lead (Completed)   Need for pneumococcal vaccination       Relevant Orders   Pneumococcal polysaccharide vaccine 23-valent greater than or equal to 2yo subcutaneous/IM (Completed)      I have discontinued Ellisyn A. Tuman's predniSONE. I am also having her start on traMADol and traMADol. Additionally, I am having her maintain her Calcium, cyclobenzaprine, lisinopril-hydrochlorothiazide, and traMADol.  Meds ordered this encounter  Medications  . traMADol (ULTRAM) 50 MG tablet    Sig: Take 1-2 tablets (50-100 mg total) by mouth every 6 (six) hours as needed.    Dispense:  120 tablet    Refill:  0    Do not fill until July 2019  . traMADol (ULTRAM) 50 MG tablet    Sig: 1-2 po q8h prn    Dispense:  120 tablet    Refill:  0    Do not fill until June 2019  . traMADol (ULTRAM) 50 MG tablet    Sig: 1-2 po q 8 h prn    Dispense:  120 tablet    Refill:  0    CMA served as scribe during this visit. History, Physical and Plan performed by medical provider. Documentation and orders reviewed and attested to.  Donato Schultz, DO

## 2018-02-12 NOTE — Assessment & Plan Note (Signed)
Well controlled, no changes to meds. Encouraged heart healthy diet such as the DASH diet and exercise as tolerated.  °

## 2018-02-12 NOTE — Patient Instructions (Signed)

## 2018-04-16 DIAGNOSIS — M545 Low back pain: Secondary | ICD-10-CM | POA: Diagnosis not present

## 2018-04-20 ENCOUNTER — Telehealth: Payer: Self-pay | Admitting: Family Medicine

## 2018-04-20 DIAGNOSIS — G8929 Other chronic pain: Secondary | ICD-10-CM | POA: Diagnosis not present

## 2018-04-20 DIAGNOSIS — M545 Low back pain: Secondary | ICD-10-CM | POA: Diagnosis not present

## 2018-04-20 DIAGNOSIS — Z760 Encounter for issue of repeat prescription: Secondary | ICD-10-CM | POA: Diagnosis not present

## 2018-04-20 NOTE — Telephone Encounter (Signed)
Copied from CRM [id number]. Topic: Quick Communication - See Telephone Encounter >> Apr 20, 2018  1:36 PM Waymon AmatoBurton, Donna F wrote: [place] and is needing to get her tramadol rx [facility] to [facility] on [address] - phone is [phone]  she states she has found a new provider in the area but the  Soonest they can see her is 04/29/18 and she is out of the tramadol  Best number is [phone]

## 2018-04-20 NOTE — Telephone Encounter (Signed)
Patient wants Dr. Laury AxonLowne to know she is out of medication

## 2018-04-23 ENCOUNTER — Other Ambulatory Visit: Payer: Self-pay | Admitting: Family Medicine

## 2018-04-23 ENCOUNTER — Encounter: Payer: Self-pay | Admitting: Family Medicine

## 2018-04-23 DIAGNOSIS — M545 Low back pain: Secondary | ICD-10-CM

## 2018-04-23 MED ORDER — TRAMADOL HCL 50 MG PO TABS: ORAL_TABLET | ORAL | 0 refills | Status: DC

## 2018-04-23 MED ORDER — TRAMADOL HCL 50 MG PO TABS: 50.0000 mg | ORAL_TABLET | Freq: Four times a day (QID) | ORAL | 0 refills | Status: DC | PRN

## 2018-04-23 NOTE — Telephone Encounter (Signed)
Sent in

## 2018-04-23 NOTE — Telephone Encounter (Signed)
Patient is calling to follow up on this. She said she wants someone to atleast call her back and let her know something. Patient is trying to be patient with this and states she is completely out.   [phone]

## 2018-07-06 DIAGNOSIS — Z23 Encounter for immunization: Secondary | ICD-10-CM | POA: Diagnosis not present

## 2019-06-29 ENCOUNTER — Encounter: Payer: Self-pay | Admitting: Family Medicine

## 2019-06-29 ENCOUNTER — Ambulatory Visit (INDEPENDENT_AMBULATORY_CARE_PROVIDER_SITE_OTHER): Payer: Medicare HMO | Admitting: Family Medicine

## 2019-06-29 ENCOUNTER — Other Ambulatory Visit: Payer: Self-pay

## 2019-06-29 DIAGNOSIS — M545 Low back pain, unspecified: Secondary | ICD-10-CM

## 2019-06-29 DIAGNOSIS — I10 Essential (primary) hypertension: Secondary | ICD-10-CM

## 2019-06-29 MED ORDER — TRAMADOL HCL 50 MG PO TABS: ORAL_TABLET | ORAL | 0 refills | Status: DC

## 2019-06-29 MED ORDER — LISINOPRIL-HYDROCHLOROTHIAZIDE 20-12.5 MG PO TABS: 1.0000 | ORAL_TABLET | Freq: Every day | ORAL | 0 refills | Status: DC

## 2019-06-29 NOTE — Progress Notes (Signed)
Virtual Visit via Video Note  I connected with Morgan [name] on 06/29/19 at 11:00 AM EDT by a video enabled telemedicine application and verified that I am speaking with the correct person using two identifiers.  Location: Patient: home Provider: home    I discussed the limitations of evaluation and management by telemedicine and the availability of in person appointments. The patient expressed understanding and agreed to proceed.  History of Present Illness: Pt is home and back in [place]--- she needs a refill on her meds She still has everything in boxes and has not been able to check her bp at home She states she saw her dr in [place] prior to moving back and her bp was ok. No complaints   Observations/Objective: No vitals obtained Pt is in NAD No sob  Assessment and Plan: 1. Essential hypertension .pt states her bp has been good She will look for her bp cuff and if she can not find it she will come in for nurse visit for bp check and flu shot  - lisinopril-hydrochlorothiazide (ZESTORETIC) 20-12.5 MG tablet; Take 1 tablet by mouth daily.  Dispense: 90 tablet; Refill: 0  2. Bilateral low back pain Stable Refill meds  Database reviewe Pt needs uds and contract - traMADol (ULTRAM) 50 MG tablet; 1-2 po q8h prn  Dispense: 120 tablet; Refill: 0   Follow Up Instructions:    I discussed the assessment and treatment plan with the patient. The patient was provided an opportunity to ask questions and all were answered. The patient agreed with the plan and demonstrated an understanding of the instructions.   The patient was advised to call back or seek an in-person evaluation if the symptoms worsen or if the condition fails to improve as anticipated.  I provided 15 minutes of non-face-to-face time during this encounter.   Ann Held, DO

## 2019-08-03 ENCOUNTER — Other Ambulatory Visit: Payer: Self-pay | Admitting: Family Medicine

## 2019-08-03 DIAGNOSIS — M545 Low back pain, unspecified: Secondary | ICD-10-CM

## 2019-08-03 NOTE — Telephone Encounter (Signed)
Medication Refill - Medication: traMADol (ULTRAM) 50 MG tablet    Has the patient contacted their pharmacy? Yes.   (Agent: If no, request that the patient contact the pharmacy for the refill.) (Agent: If yes, when and what did the pharmacy advise?)  Preferred Pharmacy (with phone number or street name): [id number] - [place], [address] AT [address]  Agent: Please be advised that RX refills may take up to 3 business days. We ask that you follow-up with your pharmacy.

## 2019-08-03 NOTE — Telephone Encounter (Signed)
Requested medication (s) are due for refill today: yes  Requested medication (s) are on the active medication list: yes  Last refill:  06/29/2019  Future visit scheduled: yes  Notes to clinic:  Refill cannot be delegated    Requested Prescriptions  Pending Prescriptions Disp Refills   traMADol (ULTRAM) 50 MG tablet 120 tablet 0    Sig: Take 1-2 tablets (50-100 mg total) by mouth every 6 (six) hours as needed.     Not Delegated - Analgesics:  Opioid Agonists Failed - 08/03/2019  3:24 PM      Failed - This refill cannot be delegated      Failed - Urine Drug Screen completed in last 360 days.      Passed - Valid encounter within last 6 months    Recent Outpatient Visits          1 month ago Essential hypertension   Archivist at [place], [place]   1 year ago [place] at [place], [place]   1 year ago High risk medication use   Archivist at [place], [place]   1 year ago Midline low back pain, unspecified chronicity, with sciatica presence unspecified   Archivist at [place], [place]   2 years ago Bilateral low back pain, unspecified chronicity, with sciatica presence unspecified   Archivist at [place], DO      Future Appointments            In 1 month [place], [place] at [employer] [facility], [place]

## 2019-08-04 ENCOUNTER — Other Ambulatory Visit: Payer: Self-pay | Admitting: Family Medicine

## 2019-08-04 DIAGNOSIS — M545 Low back pain, unspecified: Secondary | ICD-10-CM

## 2019-08-04 MED ORDER — TRAMADOL HCL 50 MG PO TABS: 50.0000 mg | ORAL_TABLET | Freq: Four times a day (QID) | ORAL | 0 refills | Status: DC | PRN

## 2019-08-04 MED ORDER — TRAMADOL HCL 50 MG PO TABS: ORAL_TABLET | ORAL | 0 refills | Status: DC

## 2019-08-04 NOTE — Telephone Encounter (Signed)
Refill request: tramadol 50mg  Last OV: 06/29/2019, #120 w/0 refills. "Pt needs uds and contract" Next OV: 09/19/2019 Last UDS: 12/03/2017  Please advise.

## 2019-08-05 ENCOUNTER — Telehealth: Payer: Self-pay

## 2019-08-05 NOTE — Telephone Encounter (Signed)
Copied from Morgan [phone]. Topic: General - Other >> Aug 03, 2019  3:16 PM Rutherford Nail, [place] wrote: Reason for CRM: Patient calling anf states that Dr Etter Sjogren wanted her to monitor BP at home. States that her readings averaging around 136/80. Please advise.

## 2019-08-05 NOTE — Telephone Encounter (Signed)
Great!    Ov 3 months 

## 2019-08-05 NOTE — Telephone Encounter (Signed)
Patient notified.  She already has an appointment for December.

## 2019-09-05 ENCOUNTER — Other Ambulatory Visit: Payer: Self-pay | Admitting: Family Medicine

## 2019-09-05 DIAGNOSIS — M545 Low back pain, unspecified: Secondary | ICD-10-CM

## 2019-09-05 DIAGNOSIS — I10 Essential (primary) hypertension: Secondary | ICD-10-CM

## 2019-09-05 NOTE — Telephone Encounter (Signed)
Copied from [place] [id number]. Topic: Quick Communication - Rx Refill/Question >> Sep 05, 2019  4:13 PM Rainey Pines A wrote: Medication: lisinopril-hydrochlorothiazide (ZESTORETIC) 20-12.5 MG tablet ,traMADol (ULTRAM) 50 MG  Has the patient contacted their pharmacy?Yes (Agent: If no, request that the patient contact the pharmacy for the refill.) (Agent: If yes, when and what did the pharmacy advise?)Contact PCP  Preferred Pharmacy (with phone number or street name): [id number] - [place], [address] AT [place] [phone] (Phone) [phone] (Fax)    Agent: Please be advised that RX refills may take up to 3 business days. We ask that you follow-up with your pharmacy.

## 2019-09-06 MED ORDER — TRAMADOL HCL 50 MG PO TABS: ORAL_TABLET | ORAL | 0 refills | Status: DC

## 2019-09-06 MED ORDER — LISINOPRIL-HYDROCHLOROTHIAZIDE 20-12.5 MG PO TABS: 1.0000 | ORAL_TABLET | Freq: Every day | ORAL | 0 refills | Status: DC

## 2019-09-06 NOTE — Telephone Encounter (Signed)
Requesting: Tramadol Contract: 12/15/2016 UDS: 12/15/2016 Last OV: 06/29/2019 Next OV: 09/19/2019 Last Refill: 10/29/202, #120--0 RF Database:   Please advise

## 2019-09-06 NOTE — Telephone Encounter (Signed)
Requested medication (s) are due for refill today: yes  Requested medication (s) are on the active medication list: yes  Last refill:  08/04/2019  Future visit scheduled: yes  Notes to clinic:  Refill cannot be delegated   Requested Prescriptions  Pending Prescriptions Disp Refills   traMADol (ULTRAM) 50 MG tablet 120 tablet 0    Sig: 1-2 po q 8 h prn     Not Delegated - Analgesics:  Opioid Agonists Failed - 09/05/2019  4:20 PM      Failed - This refill cannot be delegated      Failed - Urine Drug Screen completed in last 360 days.      Passed - Valid encounter within last 6 months    Recent Outpatient Visits          2 months ago Essential hypertension   Holiday representative at [facility] [place], [place], [place]   1 year ago Dizziness   Holiday representative at [facility] [place], [place] R, [place]   1 year ago High risk medication use   Holiday representative at [employer], [place], [place]   1 year ago Midline low back pain, unspecified chronicity, with sciatica presence unspecified   Holiday representative at [employer], [place] E, [place]   2 years ago Bilateral low back pain, unspecified chronicity, with sciatica presence unspecified   Holiday representative at [employer], [place] R, DO      Future Appointments            In 1 week Zola Button, Grayling Congress, DO [employer] at [employer], [place]           Signed Prescriptions Disp Refills   lisinopril-hydrochlorothiazide (ZESTORETIC) 20-12.5 MG tablet 90 tablet 0    Sig: Take 1 tablet by mouth daily.     Cardiovascular:  ACEI + Diuretic Combos Failed - 09/05/2019  4:20 PM      Failed - Na in normal range and within 180 days    Sodium  Date Value Ref Range Status  02/12/2018 143 135 - 146 mmol/L Final         Failed - K in normal range and within 180 days    Potassium  Date Value Ref  Range Status  02/12/2018 4.5 3.5 - 5.3 mmol/L Final         Failed - Cr in normal range and within 180 days    Creat  Date Value Ref Range Status  02/12/2018 0.84 0.50 - 0.99 mg/dL Final    Comment:    For patients >6 years of age, the reference limit for Creatinine is approximately 13% higher for people identified as African-American. .          Failed - Ca in normal range and within 180 days    Calcium  Date Value Ref Range Status  02/12/2018 9.2 8.6 - 10.4 mg/dL Final         Failed - Last BP in normal range    BP Readings from Last 1 Encounters:  02/12/18 140/72         Passed - Patient is not pregnant      Passed - Valid encounter within last 6 months    Recent Outpatient Visits          2 months ago Essential hypertension   Holiday representative at [employer]  [place], [place], [place]   1 year ago [place] at [place], DO   1 year ago High risk medication use   Archivist at [place], [place]   1 year ago Midline low back pain, unspecified chronicity, with sciatica presence unspecified   Archivist at [place], [place]   2 years ago Bilateral low back pain, unspecified chronicity, with sciatica presence unspecified   Archivist at [place], DO      Future Appointments            In 1 week [place], [place] at [facility], [facility]

## 2019-09-19 ENCOUNTER — Encounter: Payer: Self-pay | Admitting: Family Medicine

## 2019-09-19 ENCOUNTER — Ambulatory Visit (INDEPENDENT_AMBULATORY_CARE_PROVIDER_SITE_OTHER): Payer: Medicare HMO | Admitting: Family Medicine

## 2019-09-19 ENCOUNTER — Other Ambulatory Visit: Payer: Self-pay

## 2019-09-19 VITALS — Ht 65.0 in

## 2019-09-19 DIAGNOSIS — I10 Essential (primary) hypertension: Secondary | ICD-10-CM | POA: Diagnosis not present

## 2019-09-19 DIAGNOSIS — Z79899 Other long term (current) drug therapy: Secondary | ICD-10-CM | POA: Diagnosis not present

## 2019-09-19 MED ORDER — LISINOPRIL-HYDROCHLOROTHIAZIDE 20-12.5 MG PO TABS: 1.0000 | ORAL_TABLET | Freq: Every day | ORAL | 1 refills | Status: DC

## 2019-09-19 NOTE — Progress Notes (Signed)
Virtual Visit via Video Note  I connected with Morgan [name] on 09/19/19 at  1:00 PM EST by a video enabled telemedicine application and verified that I am speaking with the correct person using two identifiers.  Location: Patient: home alone  Provider: office   I discussed the limitations of evaluation and management by telemedicine and the availability of in person appointments. The patient expressed understanding and agreed to proceed.  History of Present Illness: Pt is home and needs f/u for pain meds and refill on bp meds      Observations/Objective: 109/83  p 64 No other vitals obtained  Pt is NAd   Assessment and Plan: 1. Essential hypertension Well controlled, no changes to meds. Encouraged heart healthy diet such as the DASH diet and exercise as tolerated.  - lisinopril-hydrochlorothiazide (ZESTORETIC) 20-12.5 MG tablet; Take 1 tablet by mouth daily.  Dispense: 90 tablet; Refill: 1 - Lipid panel; Future - Comprehensive metabolic panel; Future  2. High risk medication use She does not need refills today - Pain Mgmt, Profile 8 w/Conf, U; Future - Pain Mgmt, Tramadol w/medMATCH, U; Future   Follow Up Instructions:    I discussed the assessment and treatment plan with the patient. The patient was provided an opportunity to ask questions and all were answered. The patient agreed with the plan and demonstrated an understanding of the instructions.   The patient was advised to call back or seek an in-person evaluation if the symptoms worsen or if the condition fails to improve as anticipated. I provided 15 minutes of non-face-to-face time during this encounter.   Ann Held, DO

## 2019-10-06 ENCOUNTER — Other Ambulatory Visit: Payer: Self-pay | Admitting: Family Medicine

## 2019-10-06 DIAGNOSIS — M545 Low back pain, unspecified: Secondary | ICD-10-CM

## 2019-10-06 MED ORDER — TRAMADOL HCL 50 MG PO TABS: ORAL_TABLET | ORAL | 0 refills | Status: DC

## 2019-10-06 NOTE — Telephone Encounter (Signed)
Requesting: Ultram Contract:12/16/2016 UDS:12/03/2017 Last Visit:09/19/2019 Next Visit: none Last Refill: 09/06/2019  Please Advise

## 2019-10-06 NOTE — Telephone Encounter (Signed)
traMADol (ULTRAM) 50 MG tablet    Patient is requesting refill.   Pharmacy:  [facility] #[id number] [place], [place] AT [facility] [address] Phone:  [phone]  Fax:  [phone]

## 2019-10-31 ENCOUNTER — Telehealth: Payer: Self-pay | Admitting: Family Medicine

## 2019-10-31 NOTE — Telephone Encounter (Signed)
Called patient to schedule AWV. Patient did not answer will try to call patient again at a later time. SF

## 2019-11-02 DIAGNOSIS — H40023 Open angle with borderline findings, high risk, bilateral: Secondary | ICD-10-CM | POA: Diagnosis not present

## 2019-11-07 ENCOUNTER — Other Ambulatory Visit: Payer: Self-pay | Admitting: Family Medicine

## 2019-11-07 DIAGNOSIS — M545 Low back pain, unspecified: Secondary | ICD-10-CM

## 2019-11-07 NOTE — Telephone Encounter (Signed)
Caller Name: pt Phone: [phone]  Medication: traMADol (ULTRAM) 50 MG tablet  - 3-4 tablets left   Has the patient contacted their pharmacy? No - change in pharmacy  Preferred Pharmacy (with phone number or street name): [id number] - [place], [place] - [address] AT [facility] [address]

## 2019-11-08 ENCOUNTER — Other Ambulatory Visit: Payer: Self-pay | Admitting: Family Medicine

## 2019-11-08 DIAGNOSIS — M545 Low back pain, unspecified: Secondary | ICD-10-CM

## 2019-11-08 MED ORDER — TRAMADOL HCL 50 MG PO TABS: ORAL_TABLET | ORAL | 0 refills | Status: DC

## 2019-11-08 NOTE — Telephone Encounter (Signed)
Will refill but need uds and contract

## 2019-11-08 NOTE — Telephone Encounter (Signed)
Requesting: Tramadol Contract: 12/15/2016  UDS: 12/15/2016 Last OV: 09/19/2019 Next OV: N/A Last Refill: 10/06/2019, #120--0 RF Database:   Please advise

## 2019-11-16 ENCOUNTER — Other Ambulatory Visit: Payer: Self-pay | Admitting: Family Medicine

## 2019-11-16 DIAGNOSIS — Z1231 Encounter for screening mammogram for malignant neoplasm of breast: Secondary | ICD-10-CM

## 2019-11-30 DIAGNOSIS — H524 Presbyopia: Secondary | ICD-10-CM | POA: Diagnosis not present

## 2019-12-09 ENCOUNTER — Other Ambulatory Visit: Payer: Self-pay | Admitting: Family Medicine

## 2019-12-09 DIAGNOSIS — M545 Low back pain, unspecified: Secondary | ICD-10-CM

## 2019-12-09 DIAGNOSIS — I10 Essential (primary) hypertension: Secondary | ICD-10-CM

## 2019-12-09 MED ORDER — LISINOPRIL-HYDROCHLOROTHIAZIDE 20-12.5 MG PO TABS: 1.0000 | ORAL_TABLET | Freq: Every day | ORAL | 1 refills | Status: DC

## 2019-12-09 MED ORDER — TRAMADOL HCL 50 MG PO TABS: 50.0000 mg | ORAL_TABLET | Freq: Four times a day (QID) | ORAL | 0 refills | Status: DC | PRN

## 2019-12-09 NOTE — Telephone Encounter (Signed)
Requesting: Tramadol Contract: 12/15/2016 UDS: 12/15/2016 Last OV: 09/19/19  Next OV: N/A Last Refill: 11/08/19, #120--0 RF Database:   Please advise

## 2019-12-09 NOTE — Telephone Encounter (Signed)
Medication: traMADol (ULTRAM) 50 MG tablet   lisinopril-hydrochlorothiazide (ZESTORETIC) 20-12.5 MG tablet   Has the patient contacted their pharmacy? No. (If no, request that the patient contact the pharmacy for the refill.) (If yes, when and what did the pharmacy advise?)  Preferred Pharmacy (with phone number or street name):   [facility] #[id number] - [place], [place] - [address] AT [facility] [address] [place] [address]  Phone:  [phone] Fax:  [phone]   Agent: Please be advised that RX refills may take up to 3 business days. We ask that you follow-up with your pharmacy.

## 2019-12-23 ENCOUNTER — Other Ambulatory Visit: Payer: Self-pay

## 2019-12-23 ENCOUNTER — Ambulatory Visit
Admission: RE | Admit: 2019-12-23 | Discharge: 2019-12-23 | Disposition: A | Discharge status: Home / Self Care | Payer: Medicare HMO | Source: Ambulatory Visit | Attending: Family Medicine | Admitting: Family Medicine

## 2019-12-23 DIAGNOSIS — Z1231 Encounter for screening mammogram for malignant neoplasm of breast: Secondary | ICD-10-CM | POA: Diagnosis not present

## 2020-01-05 ENCOUNTER — Telehealth: Payer: Self-pay | Admitting: Family Medicine

## 2020-01-05 NOTE — Telephone Encounter (Signed)
Medication: traMADol (ULTRAM) 50 MG tablet   Has the patient contacted their pharmacy? No. (If no, request that the patient contact the pharmacy for the refill.) (If yes, when and what did the pharmacy advise?)  Preferred Pharmacy (with phone number or street name): [facility] #[id number] - [place], [place] - [address] AT [facility] [address] [place] [address]  Phone:  [phone]   Agent: Please be advised that RX refills may take up to 3 business days. We ask that you follow-up with your pharmacy.

## 2020-01-05 NOTE — Telephone Encounter (Signed)
Requesting: Tramadol Contract: 12/15/2016 UDS: 12/15/2016 Last OV: 09/19/2019 Next OV: N/A Last Refill: 12/09/19, #120--0 RF Database:   Please advise

## 2020-01-07 ENCOUNTER — Other Ambulatory Visit: Payer: Self-pay | Admitting: Family Medicine

## 2020-01-07 DIAGNOSIS — M545 Low back pain, unspecified: Secondary | ICD-10-CM

## 2020-01-07 MED ORDER — TRAMADOL HCL 50 MG PO TABS: ORAL_TABLET | ORAL | 0 refills | Status: DC

## 2020-01-07 NOTE — Telephone Encounter (Signed)
Sent but pt needs ov

## 2020-02-07 ENCOUNTER — Telehealth: Payer: Self-pay

## 2020-02-07 DIAGNOSIS — M545 Low back pain, unspecified: Secondary | ICD-10-CM

## 2020-02-07 DIAGNOSIS — I10 Essential (primary) hypertension: Secondary | ICD-10-CM

## 2020-02-07 NOTE — Telephone Encounter (Signed)
Patient called in to get a refill for traMADol (ULTRAM) 50 MG tablet [[id number]]   lisinopril-hydrochlorothiazide (ZESTORETIC) 20-12.5 MG tablet [[id number]]     Please send it to [facility] #[id number] - [place], [place] - [address] AT [facility] [address]  [address], [place] [address]  Phone:  [phone] Fax:  [phone]  DEA #:  [id number]

## 2020-02-08 MED ORDER — LISINOPRIL-HYDROCHLOROTHIAZIDE 20-12.5 MG PO TABS: 1.0000 | ORAL_TABLET | Freq: Every day | ORAL | 1 refills | Status: DC

## 2020-02-08 MED ORDER — TRAMADOL HCL 50 MG PO TABS: ORAL_TABLET | ORAL | 0 refills | Status: DC

## 2020-02-08 NOTE — Telephone Encounter (Signed)
Requesting: Tramadol Contract: 12/15/2016 UDS: 12/15/2016 Last OV: 09/19/2019 Next OV: N/A Last Refill: 01/07/20, #120--0 RF Database:   Please advise

## 2020-02-08 NOTE — Telephone Encounter (Signed)
PDMP okay, prescription refilled regularly by PCP, Rx sent

## 2020-03-08 ENCOUNTER — Other Ambulatory Visit: Payer: Self-pay | Admitting: Family Medicine

## 2020-03-08 DIAGNOSIS — M545 Low back pain, unspecified: Secondary | ICD-10-CM

## 2020-03-08 MED ORDER — TRAMADOL HCL 50 MG PO TABS: ORAL_TABLET | ORAL | 0 refills | Status: DC

## 2020-03-08 NOTE — Telephone Encounter (Signed)
Requesting: Tramadol Contract: 12/15/2016 UDS: 12/15/2016 Last OV: 09/19/2019 Next OV: N/A Last Refill: 02/08/2020, #120--0 RF Database:   Please advise

## 2020-03-08 NOTE — Telephone Encounter (Signed)
Medication:traMADol (ULTRAM) 50 MG tablet   Has the patient contacted their pharmacy? No. (If no, request that the patient contact the pharmacy for the refill.) (If yes, when and what did the pharmacy advise?)  Preferred Pharmacy (with phone number or street name): [facility] #[id number] - [place], [place] - [address] AT [facility] OF [address] & [address] [place] [address]  Phone:  [phone] Fax:  [phone]  Agent: Please be advised that RX refills may take up to 3 business days. We ask that you follow-up with your pharmacy.

## 2020-04-03 DIAGNOSIS — R69 Illness, unspecified: Secondary | ICD-10-CM | POA: Diagnosis not present

## 2020-04-10 ENCOUNTER — Other Ambulatory Visit: Payer: Self-pay

## 2020-04-10 DIAGNOSIS — M545 Low back pain, unspecified: Secondary | ICD-10-CM

## 2020-04-10 MED ORDER — TRAMADOL HCL 50 MG PO TABS: 50.0000 mg | ORAL_TABLET | Freq: Four times a day (QID) | ORAL | 0 refills | Status: DC | PRN

## 2020-04-10 NOTE — Telephone Encounter (Signed)
Pt states going out of town for a funeral and would like refill before leaving.  Requesting: Tramadol Contract: 12/15/2016 UDS: 12/03/2017 Last OV: 09/19/2019 Next OV: N/A Last Refill: 12/09/2019, #120--0 RF Database:   Please advise

## 2020-04-10 NOTE — Telephone Encounter (Signed)
Pt called requesting refill on Tramadol.  Pt is going to need to leave town soon for a funeral and would like refill done ASAP please.  Pharmacy is [facility].

## 2020-05-10 ENCOUNTER — Other Ambulatory Visit: Payer: Self-pay | Admitting: Family Medicine

## 2020-05-10 DIAGNOSIS — M545 Low back pain, unspecified: Secondary | ICD-10-CM

## 2020-05-10 MED ORDER — TRAMADOL HCL 50 MG PO TABS: 50.0000 mg | ORAL_TABLET | Freq: Four times a day (QID) | ORAL | 0 refills | Status: DC | PRN

## 2020-05-10 NOTE — Telephone Encounter (Signed)
Requesting: Tramadol Contract: 12/03/2017 UDS: 02/28/20219 Last OV: 09/19/2019 Next OV: N/A Last Refill: 04/10/2020, #120--0 RF Database:   Please advise

## 2020-05-10 NOTE — Telephone Encounter (Signed)
Medication: traMADol (ULTRAM) 50 MG tablet [[id number]]   Has the patient contacted their pharmacy? No. (If no, request that the patient contact the pharmacy for the refill.) (If yes, when and what did the pharmacy advise?)  Preferred Pharmacy (with phone number or street name): [facility] #[id number] [place], [place] - [address] AT [facility] OF [address] [place] [address]  Phone:  [phone] Fax:  [phone]  DEA #:  [id number]  Agent: Please be advised that RX refills may take up to 3 business days. We ask that you follow-up with your pharmacy.

## 2020-05-24 ENCOUNTER — Other Ambulatory Visit: Payer: Self-pay | Admitting: Family Medicine

## 2020-05-24 DIAGNOSIS — M545 Low back pain, unspecified: Secondary | ICD-10-CM

## 2020-05-24 NOTE — Telephone Encounter (Signed)
Patient states she dont have enough tramadol her next appt is 05/29/20  traMADol (ULTRAM) 50 MG tablet [[id number]]    [facility] #[id number] - [place], [place] - [address] AT [facility] OF [address]  [address], [place] [address]  Phone:  [phone] Fax:  [phone]

## 2020-05-25 MED ORDER — TRAMADOL HCL 50 MG PO TABS: 50.0000 mg | ORAL_TABLET | Freq: Four times a day (QID) | ORAL | 0 refills | Status: DC | PRN

## 2020-05-25 NOTE — Telephone Encounter (Signed)
**  Pt states she picked up 08/05 Rx but will run out before her appt**  Requesting: Tramadol  Contract: 12/15/2016 UDS: 12/15/2016 Last OV: 09/19/2019 Next OV: 05/29/2020 Last Refill: 05/10/2020, #60--0RF Database:   Please advise

## 2020-05-29 ENCOUNTER — Ambulatory Visit: Payer: Medicare HMO | Admitting: Family Medicine

## 2020-06-04 ENCOUNTER — Other Ambulatory Visit: Payer: Self-pay

## 2020-06-04 ENCOUNTER — Ambulatory Visit (INDEPENDENT_AMBULATORY_CARE_PROVIDER_SITE_OTHER): Payer: Medicare HMO | Admitting: Family Medicine

## 2020-06-04 ENCOUNTER — Encounter: Payer: Self-pay | Admitting: Family Medicine

## 2020-06-04 VITALS — BP 108/60 | HR 69 | Temp 98.7°F | Resp 18 | Ht 65.0 in | Wt 173.2 lb

## 2020-06-04 DIAGNOSIS — M545 Other chronic pain: Secondary | ICD-10-CM

## 2020-06-04 DIAGNOSIS — I10 Essential (primary) hypertension: Secondary | ICD-10-CM

## 2020-06-04 DIAGNOSIS — Z79899 Other long term (current) drug therapy: Secondary | ICD-10-CM | POA: Diagnosis not present

## 2020-06-04 DIAGNOSIS — G8929 Other chronic pain: Secondary | ICD-10-CM

## 2020-06-04 MED ORDER — TRAMADOL HCL 50 MG PO TABS: ORAL_TABLET | ORAL | 0 refills | Status: AC

## 2020-06-04 MED ORDER — LISINOPRIL 10 MG PO TABS: 10.0000 mg | ORAL_TABLET | Freq: Every day | ORAL | 3 refills | Status: AC

## 2020-06-04 NOTE — Patient Instructions (Signed)
Tramadol tablets What is this medicine? TRAMADOL (TRA ma dole) is a pain reliever. It is used to treat moderate to severe pain in adults. This medicine may be used for other purposes; ask your health care provider or pharmacist if you have questions. COMMON BRAND NAME(S): Ultram What should I tell my health care provider before I take this medicine? They need to know if you have any of these conditions:  brain tumor  depression  drug abuse or addiction  head injury  if you frequently drink alcohol containing drinks  kidney disease or trouble passing urine  liver disease  lung disease, asthma, or breathing problems  seizures or epilepsy  suicidal thoughts, plans, or attempt; a previous suicide attempt by you or a family member  an unusual or allergic reaction to tramadol, codeine, other medicines, foods, dyes, or preservatives  pregnant or trying to get pregnant  breast-feeding How should I use this medicine? Take this medicine by mouth with a full glass of water. Follow the directions on the prescription label. You can take it with or without food. If it upsets your stomach, take it with food. Do not take your medicine more often than directed. A special MedGuide will be given to you by the pharmacist with each prescription and refill. Be sure to read this information carefully each time. Talk to your pediatrician regarding the use of this medicine in children. Special care may be needed. Overdosage: If you think you have taken too much of this medicine contact a poison control center or emergency room at once. NOTE: This medicine is only for you. Do not share this medicine with others. What if I miss a dose? If you miss a dose, take it as soon as you can. If it is almost time for your next dose, take only that dose. Do not take double or extra doses. What may interact with this medicine? Do not take this medication with any of the following medicines:  MAOIs like Carbex,  Eldepryl, Marplan, Nardil, and Parnate This medicine may also interact with the following medications:  alcohol  antihistamines for allergy, cough and cold  certain medicines for anxiety or sleep  certain medicines for depression like amitriptyline, fluoxetine, sertraline  certain medicines for migraine headache like almotriptan, eletriptan, frovatriptan, naratriptan, rizatriptan, sumatriptan, zolmitriptan  certain medicines for seizures like carbamazepine, oxcarbazepine, phenobarbital, primidone  certain medicines that treat or prevent blood clots like warfarin  digoxin  furazolidone  general anesthetics like halothane, isoflurane, methoxyflurane, propofol  linezolid  local anesthetics like lidocaine, pramoxine, tetracaine  medicines that relax muscles for surgery  other narcotic medicines for pain or cough  phenothiazines like chlorpromazine, mesoridazine, prochlorperazine, thioridazine  procarbazine This list may not describe all possible interactions. Give your health care provider a list of all the medicines, herbs, non-prescription drugs, or dietary supplements you use. Also tell them if you smoke, drink alcohol, or use illegal drugs. Some items may interact with your medicine. What should I watch for while using this medicine? Tell your health care provider if your pain does not go away, if it gets worse, or if you have new or a different type of pain. You may develop tolerance to this drug. Tolerance means that you will need a higher dose of the drug for pain relief. Tolerance is normal and is expected if you take this drug for a long time. Do not suddenly stop taking your drug because you may develop a severe reaction. Your body becomes used to the   drug. This does NOT mean you are addicted. Addiction is a behavior related to getting and using a drug for a nonmedical reason. If you have pain, you have a medical reason to take pain drug. Your health care provider will tell  you how much drug to take. If your health care provider wants you to stop the drug, the dose will be slowly lowered over time to avoid any side effects. If you take other drugs that also cause drowsiness like other narcotic pain drugs, benzodiazepines, or other drugs for sleep, you may have more side effects. Give your health care provider a list of all drugs you use. He or she will tell you how much drug to take. Do not take more drug than directed. Get emergency help right away if you have trouble breathing or are unusually tired or sleepy. Talk to your health care provider about naloxone and how to get it. Naloxone is an emergency drug used for an opioid overdose. An overdose can happen if you take too much opioid. It can also happen if an opioid is taken with some other drugs or substances, like alcohol. Know the symptoms of an overdose, like trouble breathing, unusually tired or sleepy, or not being able to respond or wake up. Make sure to tell caregivers and close contacts where it is stored. Make sure they know how to use it. After naloxone is given, you must get emergency help right away. Naloxone is a temporary treatment. Repeat doses may be needed. This drug may cause serious skin reactions. They can happen weeks to months after starting the drug. Contact your health care provider right away if you notice fevers or flu-like symptoms with a rash. The rash may be red or purple and then turn into blisters or peeling of the skin. Or, you might notice a red rash with swelling of the face, lips, or lymph nodes in your neck or under your arms. You may get drowsy or dizzy. Do not drive, use machinery, or do anything that needs mental alertness until you know how this drug affects you. Do not stand up or sit up quickly, especially if you are an older patient. This reduces the risk of dizzy or fainting spells. Alcohol may interfere with the effect of this drug. Avoid alcoholic drinks. This drug will cause  constipation. If you do not have a bowel movement for 3 days, call your health care provider. Your mouth may get dry. Chewing sugarless gum or sucking hard candy and drinking plenty of water may help. Contact your health care provider if the problem does not go away or is severe. What side effects may I notice from receiving this medicine? Side effects that you should report to your doctor or health care professional as soon as possible:  allergic reactions like skin rash, itching or hives, swelling of the face, lips, or tongue  breathing problems  confusion  redness, blistering, peeling or loosening of the skin, including inside the mouth  seizures  signs and symptoms of low blood pressure like dizziness; feeling faint or lightheaded, falls; unusually weak or tired  trouble passing urine or change in the amount of urine Side effects that usually do not require medical attention (report to your doctor or health care professional if they continue or are bothersome):  constipation  dry mouth  nausea, vomiting  tiredness This list may not describe all possible side effects. Call your doctor for medical advice about side effects. You may report side effects to   FDA at 1-800-FDA-1088. Where should I keep my medicine? Keep out of the reach of children. This medicine may cause accidental overdose and death if it taken by other adults, children, or pets. Mix any unused medicine with a substance like cat litter or coffee grounds. Then throw the medicine away in a sealed container like a sealed bag or a coffee can with a lid. Do not use the medicine after the expiration date. Store at room temperature between 15 and 30 degrees C (59 and 86 degrees F). NOTE: This sheet is a summary. It may not cover all possible information. If you have questions about this medicine, talk to your doctor, pharmacist, or health care provider.  2020 Elsevier/Gold Standard (2019-05-02 13:08:25)  

## 2020-06-04 NOTE — Progress Notes (Signed)
Patient ID: Morgan [name], female    DOB: 03-09-1951  Age: 69 y.o. MRN: [id number]    Subjective:  Subjective  HPI Morgan [name] presents for f/u tramadol and bp.  She has been on a gluten free diet and bp running low.   Pt is asymptomatic   Review of Systems  Constitutional: Negative for appetite change, diaphoresis, fatigue and unexpected weight change.  Eyes: Negative for pain, redness and visual disturbance.  Respiratory: Negative for cough, chest tightness, shortness of breath and wheezing.   Cardiovascular: Negative for chest pain, palpitations and leg swelling.  Endocrine: Negative for cold intolerance, heat intolerance, polydipsia, polyphagia and polyuria.  Genitourinary: Negative for difficulty urinating, dysuria and frequency.  Neurological: Negative for dizziness, light-headedness, numbness and headaches.    History Past Medical History:  Diagnosis Date  . Asthma   . Hypertension   . Low back pain     She has a past surgical history that includes Back surgery.   Her family history includes Cancer in an other family member; Hypertension in an other family member; Stroke in an other family member; Throat cancer in her sister.She reports that she has quit smoking. She has never used smokeless tobacco. She reports that she does not drink alcohol and does not use drugs.  Current Outpatient Medications on File Prior to Visit  Medication Sig Dispense Refill  . Calcium 500-100 MG-UNIT CHEW Calcium 500    . traMADol (ULTRAM) 50 MG tablet 1-2 po q8h prn 120 tablet 0   No current facility-administered medications on file prior to visit.     Objective:  Objective  Physical Exam Vitals and nursing note reviewed.  Constitutional:      Appearance: She is well-developed.  HENT:     Head: Normocephalic and atraumatic.  Eyes:     Conjunctiva/sclera: Conjunctivae normal.  Neck:     Thyroid: No thyromegaly.     Vascular: No carotid bruit or JVD.  Cardiovascular:      Rate and Rhythm: Normal rate and regular rhythm.     Heart sounds: Normal heart sounds. No murmur heard.   Pulmonary:     Effort: Pulmonary effort is normal. No respiratory distress.     Breath sounds: Normal breath sounds. No wheezing or rales.  Chest:     Chest wall: No tenderness.  Musculoskeletal:     Cervical back: Normal range of motion and neck supple.  Neurological:     Mental Status: She is alert and oriented to person, place, and time.    BP 108/60 (BP Location: Right Arm, Patient Position: Sitting, Cuff Size: Normal)   Pulse 69   Temp 98.7 F (37.1 C) (Oral)   Resp 18   Ht 5\' 5"  (1.651 m)   Wt 173 lb 3.2 oz (78.6 kg)   SpO2 96%   BMI 28.82 kg/m  Wt Readings from Last 3 Encounters:  06/04/20 173 lb 3.2 oz (78.6 kg)  02/12/18 165 lb (74.8 kg)  12/03/17 169 lb 12.8 oz (77 kg)     Lab Results  Component Value Date   WBC 5.5 02/29/2016   HGB 11.3 (L) 02/29/2016   HCT 34.1 (L) 02/29/2016   PLT 208 02/29/2016   GLUCOSE 87 02/12/2018   CHOL 162 02/12/2018   TRIG 67 02/12/2018   HDL 55 02/12/2018   LDLCALC 92 02/12/2018   ALT 17 02/12/2018   AST 15 02/12/2018   NA 143 02/12/2018   K 4.5 02/12/2018   CL 105 02/12/2018  CREATININE 0.84 02/12/2018   BUN 13 02/12/2018   CO2 31 02/12/2018   TSH 5.07 06/04/2012    MM 3D SCREEN BREAST BILATERAL  Result Date: 12/26/2019 CLINICAL DATA:  Screening. EXAM: DIGITAL SCREENING BILATERAL MAMMOGRAM WITH TOMO AND CAD COMPARISON:  Previous exam(s). ACR Breast Density Category b: There are scattered areas of fibroglandular density. FINDINGS: There are no findings suspicious for malignancy. Images were processed with CAD. IMPRESSION: No mammographic evidence of malignancy. A result letter of this screening mammogram will be mailed directly to the patient. RECOMMENDATION: Screening mammogram in one year. (Code:SM-B-01Y) BI-RADS CATEGORY  1: Negative. Electronically Signed   By: Frederico Hamman M.D.   On: 12/26/2019 13:42      Assessment & Plan:  Plan  I have discontinued Shelbee A. Litton's cyclobenzaprine and lisinopril-hydrochlorothiazide. I am also having her start on lisinopril. Additionally, I am having her maintain her Calcium, traMADol, and traMADol.  Meds ordered this encounter  Medications  . traMADol (ULTRAM) 50 MG tablet    Sig: 1-2 po q 8 h prn    Dispense:  120 tablet    Refill:  0  . lisinopril (ZESTRIL) 10 MG tablet    Sig: Take 1 tablet (10 mg total) by mouth daily.    Dispense:  90 tablet    Refill:  3    Problem List Items Addressed This Visit      Unprioritized   Essential hypertension - Primary    Running low -- pt has lost some weight and on gluten free diet  Lisinopril hct changed to lisinopril 10 mg daily       Relevant Medications   lisinopril (ZESTRIL) 10 MG tablet   Other Relevant Orders   Lipid panel   Comprehensive metabolic panel    Other Visit Diagnoses    Bilateral low back pain       Relevant Medications   traMADol (ULTRAM) 50 MG tablet      Follow-up: Return in about 6 months (around 12/03/2020), or if symptoms worsen or fail to improve, for annual exam, fasting.  Donato Schultz, DO

## 2020-06-04 NOTE — Assessment & Plan Note (Signed)
meds refilled uds done  Database reviewed Contract renewed

## 2020-06-04 NOTE — Assessment & Plan Note (Signed)
Running low -- pt has lost some weight and on gluten free diet  Lisinopril hct changed to lisinopril 10 mg daily

## 2020-06-05 ENCOUNTER — Other Ambulatory Visit: Payer: Self-pay | Admitting: Family Medicine

## 2020-06-05 DIAGNOSIS — Z79899 Other long term (current) drug therapy: Secondary | ICD-10-CM | POA: Diagnosis not present

## 2020-06-05 DIAGNOSIS — I10 Essential (primary) hypertension: Secondary | ICD-10-CM

## 2020-06-05 LAB — COMPREHENSIVE METABOLIC PANEL
AG Ratio: 2.2 (calc) (ref 1.0–2.5)
ALT: 20 U/L (ref 6–29)
AST: 16 U/L (ref 10–35)
Albumin: 4.2 g/dL (ref 3.6–5.1)
Alkaline phosphatase (APISO): 64 U/L (ref 37–153)
BUN: 11 mg/dL (ref 7–25)
CO2: 32 mmol/L (ref 20–32)
Calcium: 9.5 mg/dL (ref 8.6–10.4)
Chloride: 104 mmol/L (ref 98–110)
Creat: 0.84 mg/dL (ref 0.60–0.93)
Globulin: 1.9 g/dL (calc) (ref 1.9–3.7)
Glucose, Bld: 72 mg/dL (ref 65–99)
Potassium: 4.2 mmol/L (ref 3.5–5.3)
Sodium: 142 mmol/L (ref 135–146)
Total Bilirubin: 0.4 mg/dL (ref 0.2–1.2)
Total Protein: 6.1 g/dL (ref 6.1–8.1)

## 2020-06-05 LAB — LIPID PANEL
Cholesterol: 179 mg/dL (ref <200)
HDL: 54 mg/dL (ref >=50)
LDL Cholesterol (Calc): 98 mg/dL (calc)
Non-HDL Cholesterol (Calc): 125 mg/dL (calc) (ref <130)
Total CHOL/HDL Ratio: 3.3 (calc) (ref <5.0)
Triglycerides: 178 mg/dL — ABNORMAL HIGH (ref <150)

## 2020-06-05 NOTE — Addendum Note (Signed)
Addended by: Roxanne Gates on: 06/05/2020 10:31 AM   Modules accepted: Orders

## 2020-06-06 LAB — DRUG MONITORING, PANEL 8 WITH CONFIRMATION, URINE
6 Acetylmorphine: NEGATIVE ng/mL (ref <10)
Alcohol Metabolites: NEGATIVE ng/mL
Amphetamines: NEGATIVE ng/mL (ref <500)
Benzodiazepines: NEGATIVE ng/mL (ref <100)
Buprenorphine, Urine: NEGATIVE ng/mL (ref <5)
Cocaine Metabolite: NEGATIVE ng/mL (ref <150)
Creatinine: 155.4 mg/dL
MDMA: NEGATIVE ng/mL (ref <500)
Marijuana Metabolite: NEGATIVE ng/mL (ref <20)
Opiates: NEGATIVE ng/mL (ref <100)
Oxidant: NEGATIVE ug/mL
Oxycodone: NEGATIVE ng/mL (ref <100)
pH: 7.4 (ref 4.5–9.0)

## 2020-06-06 LAB — DM TEMPLATE

## 2020-07-11 ENCOUNTER — Other Ambulatory Visit: Payer: Self-pay | Admitting: Family Medicine

## 2020-07-11 DIAGNOSIS — M545 Low back pain, unspecified: Secondary | ICD-10-CM

## 2020-07-11 NOTE — Telephone Encounter (Signed)
Medication: traMADol (ULTRAM) 50 MG tablet [[id number]]    Has the patient contacted their pharmacy? No. (If no, request that the patient contact the pharmacy for the refill.) (If yes, when and what did the pharmacy advise?)  Preferred Pharmacy (with phone number or street name): [facility] #[id number] [place], [place] - [address] AT [facility] OF [address]  [address] [place] [address]  Phone:  [phone] Fax:  [phone]  DEA #:  [id number]  Agent: Please be advised that RX refills may take up to 3 business days. We ask that you follow-up with your pharmacy.

## 2020-07-11 NOTE — Telephone Encounter (Signed)
Requesting: Tramadol  Contract: 06/04/20 UDS: 06/04/20 Last OV: 06/04/20 Next OV: N/A Last Refill: 06/04/20, #120--0 RF Database:   Please advise

## 2020-07-12 MED ORDER — TRAMADOL HCL 50 MG PO TABS: ORAL_TABLET | ORAL | 0 refills | Status: AC

## 2020-07-16 DIAGNOSIS — H25813 Combined forms of age-related cataract, bilateral: Secondary | ICD-10-CM | POA: Diagnosis not present

## 2020-07-16 DIAGNOSIS — H40013 Open angle with borderline findings, low risk, bilateral: Secondary | ICD-10-CM | POA: Diagnosis not present

## 2020-08-15 DIAGNOSIS — M8580 Other specified disorders of bone density and structure, unspecified site: Secondary | ICD-10-CM | POA: Diagnosis not present

## 2020-08-15 DIAGNOSIS — K900 Celiac disease: Secondary | ICD-10-CM | POA: Diagnosis not present

## 2020-08-15 DIAGNOSIS — J45909 Unspecified asthma, uncomplicated: Secondary | ICD-10-CM | POA: Diagnosis not present

## 2020-08-15 DIAGNOSIS — M48062 Spinal stenosis, lumbar region with neurogenic claudication: Secondary | ICD-10-CM | POA: Diagnosis not present

## 2020-08-15 DIAGNOSIS — I10 Essential (primary) hypertension: Secondary | ICD-10-CM | POA: Diagnosis not present

## 2021-11-30 IMAGING — MG DIGITAL SCREENING BILAT W/ TOMO W/ CAD
6 of 10 series · 6 of 30 positions shown · non-contrast
Comparison: Previous exam(s).

CLINICAL DATA: Screening.

EXAM:
DIGITAL SCREENING BILATERAL MAMMOGRAM WITH TOMO AND CAD

[L MLO synth-2D (1 of 2)]
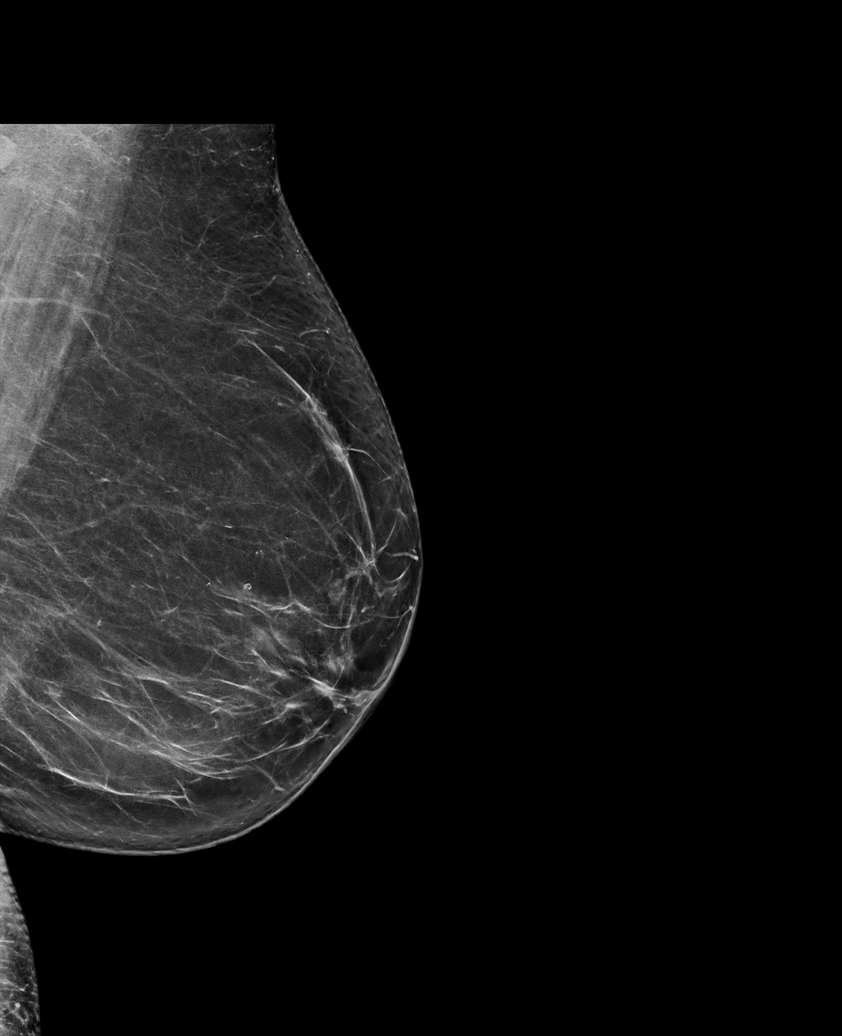

[L MLO synth-2D (2 of 2)]
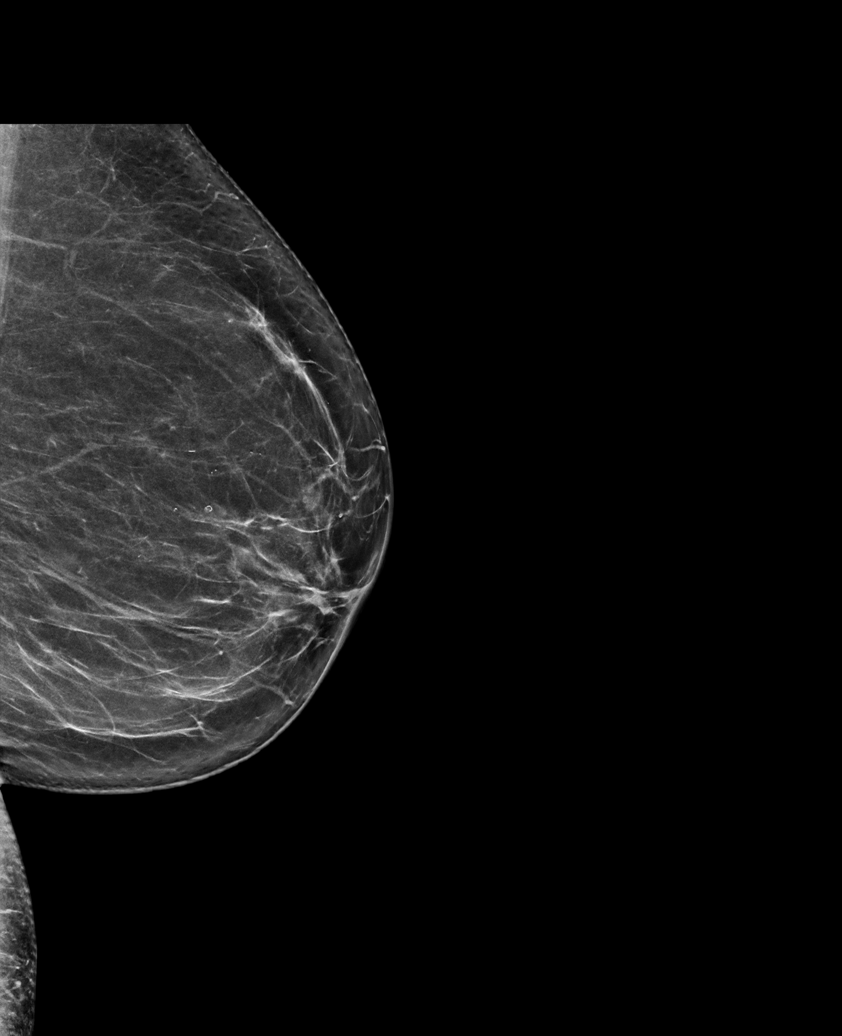

[R MLO synth-2D]
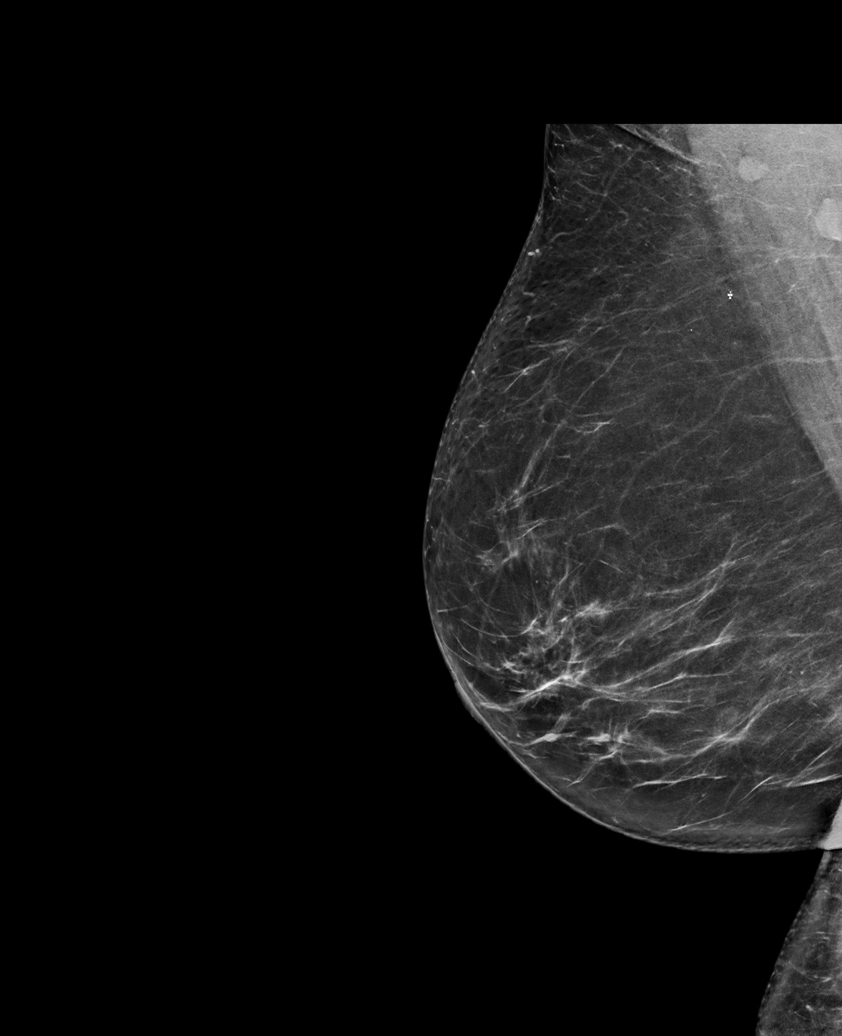

[R CC synth-2D]
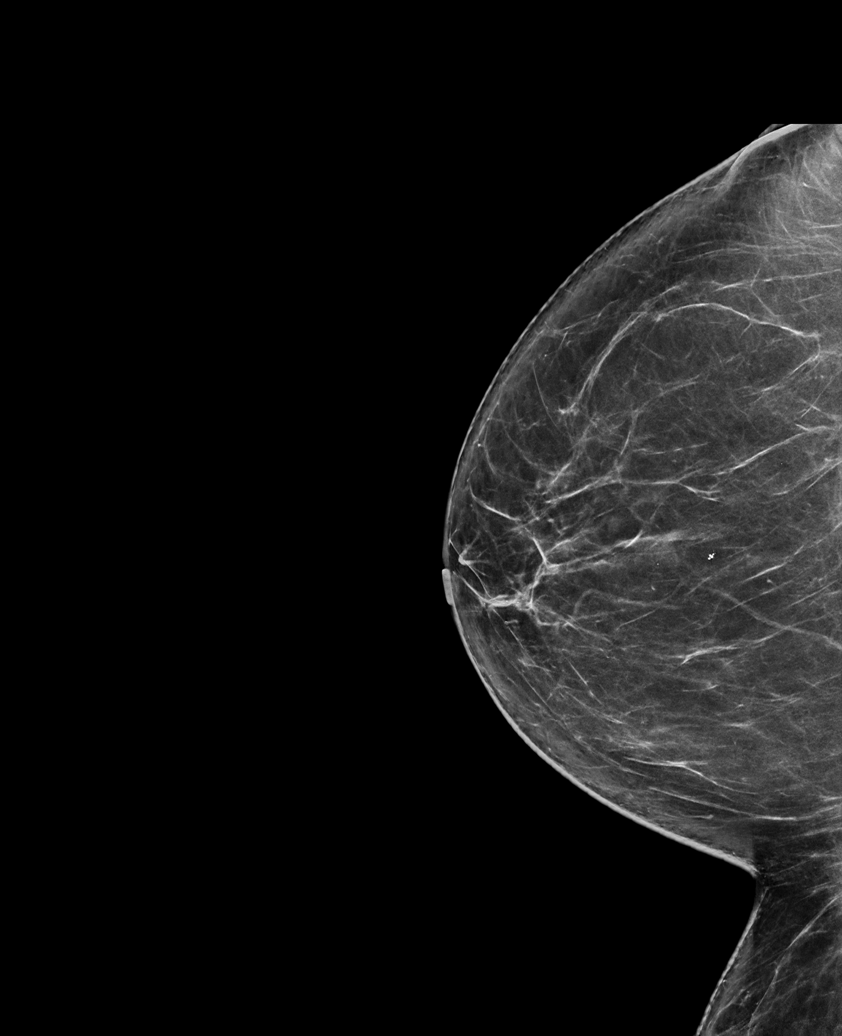

[L CC synth-2D]
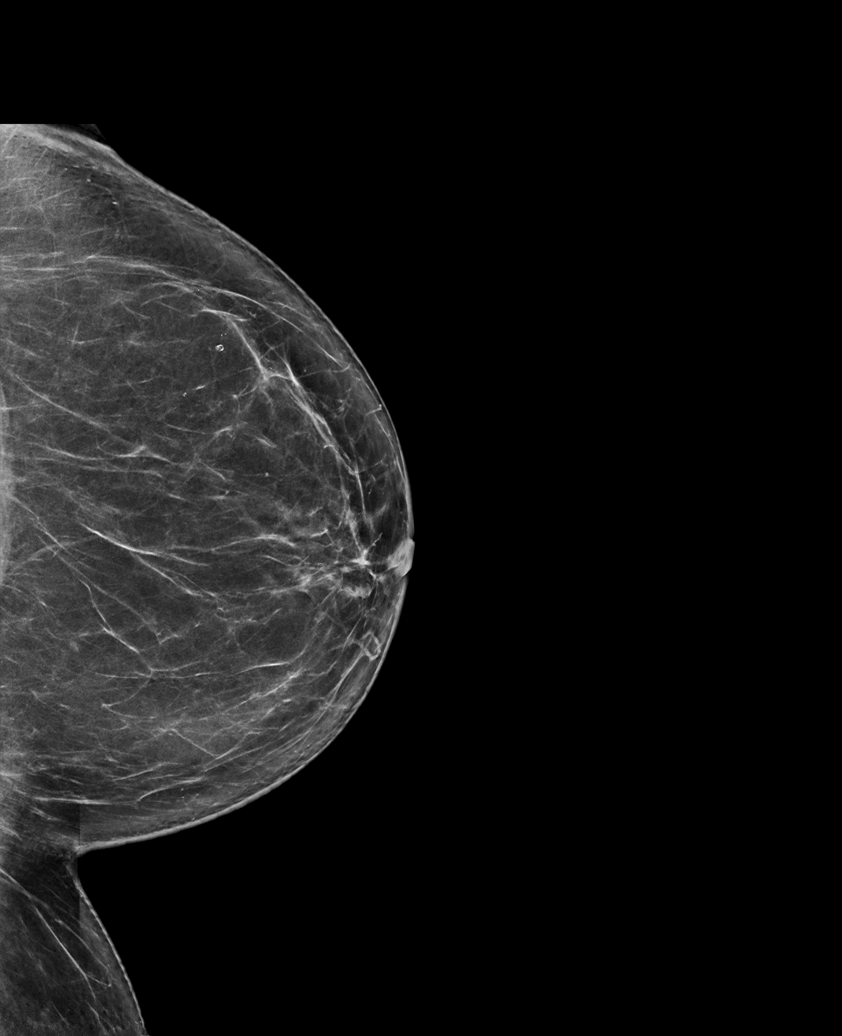

[R MLO tomo · tomo slice 41/80.0]
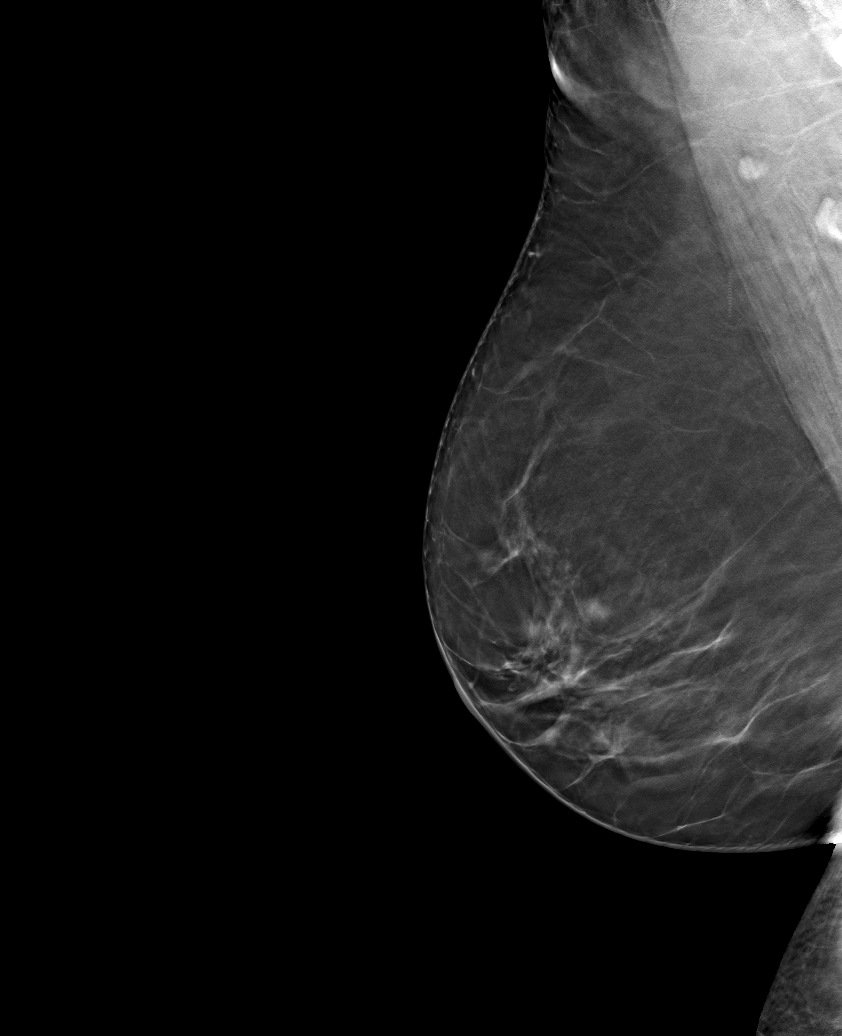

[6 of 30 positions shown; findings below may reference images not displayed]

ACR Breast Density Category b: There are scattered areas of
fibroglandular density.
FINDINGS: There are no findings suspicious for malignancy. Images were
processed with CAD.
IMPRESSION: No mammographic evidence of malignancy. A result letter of this
screening mammogram will be mailed directly to the patient.

RECOMMENDATION:
Screening mammogram in one year. (Code:CN-U-775)

BI-RADS CATEGORY  1: Negative.
